# Patient Record
Sex: Female | Born: 1950
Health system: Southern US, Community
[De-identification: ages and names within clinical notes are randomized; demographics above are authoritative.]

## PROBLEM LIST (undated history)

## (undated) DIAGNOSIS — I10 Essential (primary) hypertension: Secondary | ICD-10-CM

## (undated) DIAGNOSIS — E039 Hypothyroidism, unspecified: Secondary | ICD-10-CM

## (undated) DIAGNOSIS — M069 Rheumatoid arthritis, unspecified: Secondary | ICD-10-CM

## (undated) DIAGNOSIS — C439 Malignant melanoma of skin, unspecified: Secondary | ICD-10-CM

## (undated) DIAGNOSIS — R87619 Unspecified abnormal cytological findings in specimens from cervix uteri: Secondary | ICD-10-CM

## (undated) DIAGNOSIS — R011 Cardiac murmur, unspecified: Secondary | ICD-10-CM

## (undated) DIAGNOSIS — T148XXA Other injury of unspecified body region, initial encounter: Secondary | ICD-10-CM

## (undated) DIAGNOSIS — N200 Calculus of kidney: Secondary | ICD-10-CM

## (undated) DIAGNOSIS — M810 Age-related osteoporosis without current pathological fracture: Secondary | ICD-10-CM

## (undated) HISTORY — DX: Cardiac murmur, unspecified: R01.1

## (undated) HISTORY — PX: MELANOMA EXCISION: SHX5266

## (undated) HISTORY — PX: OTHER SURGICAL HISTORY: SHX169

## (undated) HISTORY — PX: REDUCTION MAMMAPLASTY: SUR839

## (undated) HISTORY — DX: Other injury of unspecified body region, initial encounter: T14.8XXA

## (undated) HISTORY — PX: BREAST BIOPSY: SHX20

## (undated) HISTORY — PX: APPENDECTOMY: SHX54

## (undated) HISTORY — DX: Rheumatoid arthritis, unspecified: M06.9

## (undated) HISTORY — DX: Calculus of kidney: N20.0

## (undated) HISTORY — DX: Unspecified abnormal cytological findings in specimens from cervix uteri: R87.619

## (undated) HISTORY — PX: BREAST REDUCTION SURGERY: SHX8

## (undated) HISTORY — DX: Malignant melanoma of skin, unspecified: C43.9

## (undated) HISTORY — DX: Age-related osteoporosis without current pathological fracture: M81.0

---

## 1968-02-24 HISTORY — PX: CERVIX LESION DESTRUCTION: SHX591

## 2005-05-21 ENCOUNTER — Other Ambulatory Visit: Admission: RE | Admit: 2005-05-21 | Discharge: 2005-05-21 | Payer: Self-pay | Admitting: Obstetrics and Gynecology

## 2005-09-10 ENCOUNTER — Encounter (INDEPENDENT_AMBULATORY_CARE_PROVIDER_SITE_OTHER): Payer: Self-pay | Admitting: *Deleted

## 2005-09-10 ENCOUNTER — Inpatient Hospital Stay (HOSPITAL_COMMUNITY): Admission: EM | Admit: 2005-09-10 | Discharge: 2005-09-16 | Payer: Self-pay | Admitting: Emergency Medicine

## 2005-09-18 ENCOUNTER — Inpatient Hospital Stay (HOSPITAL_COMMUNITY): Admission: EM | Admit: 2005-09-18 | Discharge: 2005-10-09 | Payer: Self-pay | Admitting: Emergency Medicine

## 2005-10-16 ENCOUNTER — Ambulatory Visit (HOSPITAL_COMMUNITY): Admission: RE | Admit: 2005-10-16 | Discharge: 2005-10-16 | Payer: Self-pay | Admitting: Surgery

## 2005-11-04 ENCOUNTER — Ambulatory Visit (HOSPITAL_COMMUNITY): Admission: RE | Admit: 2005-11-04 | Discharge: 2005-11-04 | Payer: Self-pay | Admitting: Surgery

## 2005-11-09 ENCOUNTER — Inpatient Hospital Stay (HOSPITAL_COMMUNITY): Admission: RE | Admit: 2005-11-09 | Discharge: 2005-11-10 | Payer: Self-pay | Admitting: Surgery

## 2005-11-09 ENCOUNTER — Encounter (INDEPENDENT_AMBULATORY_CARE_PROVIDER_SITE_OTHER): Payer: Self-pay | Admitting: *Deleted

## 2005-12-01 ENCOUNTER — Encounter: Admission: RE | Admit: 2005-12-01 | Discharge: 2005-12-01 | Payer: Self-pay | Admitting: Surgery

## 2006-06-11 ENCOUNTER — Other Ambulatory Visit: Admission: RE | Admit: 2006-06-11 | Discharge: 2006-06-11 | Payer: Self-pay | Admitting: Internal Medicine

## 2006-06-15 ENCOUNTER — Ambulatory Visit (HOSPITAL_COMMUNITY): Admission: RE | Admit: 2006-06-15 | Discharge: 2006-06-15 | Payer: Self-pay | Admitting: Obstetrics & Gynecology

## 2007-06-16 ENCOUNTER — Ambulatory Visit (HOSPITAL_COMMUNITY): Admission: RE | Admit: 2007-06-16 | Discharge: 2007-06-16 | Payer: Self-pay | Admitting: Family Medicine

## 2007-09-13 ENCOUNTER — Other Ambulatory Visit: Admission: RE | Admit: 2007-09-13 | Discharge: 2007-09-13 | Payer: Self-pay | Admitting: Obstetrics and Gynecology

## 2008-07-02 ENCOUNTER — Ambulatory Visit: Payer: Self-pay | Admitting: Sports Medicine

## 2008-07-02 DIAGNOSIS — M6740 Ganglion, unspecified site: Secondary | ICD-10-CM | POA: Insufficient documentation

## 2008-07-19 ENCOUNTER — Encounter: Admission: RE | Admit: 2008-07-19 | Discharge: 2008-07-19 | Payer: Self-pay | Admitting: Endocrinology

## 2008-09-17 ENCOUNTER — Ambulatory Visit (HOSPITAL_COMMUNITY): Admission: RE | Admit: 2008-09-17 | Discharge: 2008-09-17 | Payer: Self-pay | Admitting: Obstetrics & Gynecology

## 2008-11-12 ENCOUNTER — Ambulatory Visit: Payer: Self-pay | Admitting: Sports Medicine

## 2009-01-03 ENCOUNTER — Encounter: Payer: Self-pay | Admitting: Sports Medicine

## 2009-01-03 ENCOUNTER — Ambulatory Visit: Payer: Self-pay | Admitting: Sports Medicine

## 2009-03-07 ENCOUNTER — Encounter: Payer: Self-pay | Admitting: Sports Medicine

## 2009-05-07 ENCOUNTER — Emergency Department (HOSPITAL_COMMUNITY): Admission: EM | Admit: 2009-05-07 | Discharge: 2009-05-08 | Payer: Self-pay | Admitting: Emergency Medicine

## 2009-09-30 ENCOUNTER — Ambulatory Visit (HOSPITAL_COMMUNITY): Admission: RE | Admit: 2009-09-30 | Discharge: 2009-09-30 | Payer: Self-pay | Admitting: Obstetrics & Gynecology

## 2010-03-25 NOTE — Consult Note (Signed)
Summary: [facility]  [facility]   Imported By: Marily Memos 03/07/2009 08:54:52  _____________________________________________________________________  External Attachment:    Type:   Image     Comment:   External Document

## 2010-04-17 ENCOUNTER — Other Ambulatory Visit: Payer: Self-pay | Admitting: Dermatology

## 2010-05-19 ENCOUNTER — Emergency Department (HOSPITAL_COMMUNITY): Payer: Federal, State, Local not specified - PPO

## 2010-05-19 ENCOUNTER — Emergency Department (HOSPITAL_COMMUNITY)
Admission: EM | Admit: 2010-05-19 | Discharge: 2010-05-19 | Disposition: A | Discharge status: Other Acute Inpatient Hospital | Payer: Federal, State, Local not specified - PPO | Source: Home / Self Care

## 2010-05-19 ENCOUNTER — Observation Stay (HOSPITAL_COMMUNITY)
Admission: EM | Admit: 2010-05-19 | Discharge: 2010-05-20 | Disposition: A | Discharge status: Home / Self Care | Payer: Federal, State, Local not specified - PPO | Source: Other Acute Inpatient Hospital | Attending: Cardiology | Admitting: Cardiology

## 2010-05-19 DIAGNOSIS — I10 Essential (primary) hypertension: Secondary | ICD-10-CM | POA: Insufficient documentation

## 2010-05-19 DIAGNOSIS — R079 Chest pain, unspecified: Secondary | ICD-10-CM | POA: Insufficient documentation

## 2010-05-19 DIAGNOSIS — I498 Other specified cardiac arrhythmias: Secondary | ICD-10-CM | POA: Insufficient documentation

## 2010-05-19 DIAGNOSIS — E039 Hypothyroidism, unspecified: Secondary | ICD-10-CM | POA: Insufficient documentation

## 2010-05-19 DIAGNOSIS — I200 Unstable angina: Principal | ICD-10-CM | POA: Insufficient documentation

## 2010-05-19 LAB — DIFFERENTIAL
Basophils Absolute: 0 10*3/uL (ref 0.0–0.1)
Lymphocytes Relative: 29 % (ref 12–46)
Lymphs Abs: 2 10*3/uL (ref 0.7–4.0)
Monocytes Relative: 6 % (ref 3–12)

## 2010-05-19 LAB — APTT: aPTT: 31 seconds (ref 24–37)

## 2010-05-19 LAB — CBC
MCH: 27.7 pg (ref 26.0–34.0)
Platelets: ADEQUATE 10*3/uL (ref 150–400)
RBC: 4.91 MIL/uL (ref 3.87–5.11)

## 2010-05-19 LAB — COMPREHENSIVE METABOLIC PANEL
AST: 24 U/L (ref 0–37)
Albumin: 4 g/dL (ref 3.5–5.2)
Calcium: 10.1 mg/dL (ref 8.4–10.5)
Chloride: 105 mEq/L (ref 96–112)
Creatinine, Ser: 0.85 mg/dL (ref 0.4–1.2)
GFR calc Af Amer: >60 mL/min (ref >60)
Total Bilirubin: 0.7 mg/dL (ref 0.3–1.2)
Total Protein: 7.1 g/dL (ref 6.0–8.3)

## 2010-05-19 LAB — POCT CARDIAC MARKERS
CKMB, poc: <1 ng/mL — ABNORMAL LOW (ref 1.0–8.0)
Troponin i, poc: <0.05 ng/mL (ref 0.00–0.09)

## 2010-05-19 LAB — PROTIME-INR
INR: 1.04 (ref 0.00–1.49)
Prothrombin Time: 13.8 seconds (ref 11.6–15.2)

## 2010-05-20 LAB — LIPID PANEL
HDL: 43 mg/dL (ref >39)
LDL Cholesterol: 98 mg/dL (ref 0–99)
Triglycerides: 95 mg/dL (ref <150)
VLDL: 19 mg/dL (ref 0–40)

## 2010-05-20 LAB — MAGNESIUM: Magnesium: 2.1 mg/dL (ref 1.5–2.5)

## 2010-05-20 LAB — CARDIAC PANEL(CRET KIN+CKTOT+MB+TROPI): Total CK: 47 U/L (ref 7–177)

## 2010-05-20 LAB — BASIC METABOLIC PANEL
CO2: 25 mEq/L (ref 19–32)
GFR calc Af Amer: >60 mL/min (ref >60)
Glucose, Bld: 105 mg/dL — ABNORMAL HIGH (ref 70–99)
Potassium: 3.8 mEq/L (ref 3.5–5.1)
Sodium: 142 mEq/L (ref 135–145)

## 2010-05-20 LAB — CBC
HCT: 37.2 % (ref 36.0–46.0)
Hemoglobin: 12.6 g/dL (ref 12.0–15.0)
RBC: 4.6 MIL/uL (ref 3.87–5.11)
WBC: 6.1 10*3/uL (ref 4.0–10.5)

## 2010-05-20 LAB — POCT CARDIAC MARKERS
CKMB, poc: <1 ng/mL — ABNORMAL LOW (ref 1.0–8.0)
Troponin i, poc: <0.05 ng/mL (ref 0.00–0.09)

## 2010-05-20 LAB — C-REACTIVE PROTEIN: CRP: 0.3 mg/dL — ABNORMAL LOW (ref <0.6)

## 2010-05-20 NOTE — H&P (Signed)
[name], [name] NO.:  [id number]  MEDICAL RECORD NO.:  [id number]           PATIENT TYPE:  E  LOCATION:  WLED                         FACILITY:  [facility]  PHYSICIAN:  Vania Rea, M.D. DATE OF BIRTH:  08-18-1950  DATE OF ADMISSION:  05/19/2010 DATE OF DISCHARGE:                             HISTORY & PHYSICAL   PRIMARY CARE PHYSICIAN:  Gretta Arab. Valentina Lucks, M.D.  CHIEF COMPLAINT:  Chest pain.  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  She exercises by walking about one to two miles once per week.  She is somewhat overweight.  The patient reports that typically when she is going uphill on her walk she tends to get chest discomfort radiating to her throat, which is relieved by rest,  but other than that has no history of chest discomfort.  No leg swelling or orthopnea.  The patient has been in good baseline state of health until about 10:30 this morning.  While resting at work, she had sudden onset of a sharp, right-sided chest pain in her breast, which radiated to her central chest, became a tightness and radiated up into her neck and her jaw.  She says she had a squeezing tightness but had no difficulty breathing.  No diaphoresis.  No dizziness, but she was slightly nauseous.  He felt it might be gastrointestinal and took some Tums, but it gave no relief.  Eventually, the patient presented to the emergency room some 40 minutes later, and by that time, the pain had decreased from an 8/10 to about a 5/10.  While waiting in the emergency room to be seen, the chest pain subsided, and she only had a mild discomfort in her neck area.  Eventually, she received sublingual nitroglycerin and aspirin, and the pain resolved completely.  She has no past history of a cardiac workup.  She is hypertensive and checks her own blood pressure about once per week, and it is typically 130/80.  She is always  had a low pulse in the 50s even before her diagnosis with hypothyroidism and continues to be bradycardic after treatment with Synthroid.  She has had no fever, cough or cold.  No black or bloody stool.  PAST MEDICAL HISTORY: 1. Hypertension. 2. Hypothyroidism.  PAST SURGICAL HISTORY:  Acute appendicitis treated with laparoscopic appendectomy complicated by postoperative abscess with fistula formation, now essentially healed.  That was some 5 years ago.  MEDICATIONS:  Synthroid 75 mcg daily, aspirin 81 mg daily, HCTZ 25 mg daily, Ativan 0.5 mg at bedtime as needed, vitamin D 1000 units daily, Citracal with D 1 tablet daily, multivitamins daily.  ALLERGIES:  No known drug allergies.  SOCIAL HISTORY:  Denies tobacco or illicit drug use.  Drinks one drink per week socially.  She is married and lives with her husband.  She has one grown daughter who lives away.  She works as a Engineer, civil (consulting) at a [employer] as noted up above.  CODE STATUS:  She is a full code.  FAMILY HISTORY:  Significant for father who died  of acute myocardial infarction at age 19.  Her mother currently has breast cancer.  She has a half brother who died of an acute MI at age 51.  She had a half brother who died of leukemia in infancy.  Other than this, she has no significant family medical history.  REVIEW OF SYSTEMS:  Significant only for insomnia, which she feels is related to her anxiety.  Other than this, her review of systems is unremarkable.  PHYSICAL EXAM:  A very pleasant, somewhat obese, Caucasian lady reclining in the stretcher.  She gives her height is 5 feet exactly and her weight as 158 pounds. VITAL SIGNS:  Her temperature is 97.8, her pulse is 50, respirations 16, blood pressure 168/62.  She is saturating 100% on 2 liters. Her pupils are round and equal, and mucous membranes pink.  Anicteric. No cervical lymphadenopathy or thyromegaly.  No carotid bruit.  No jugular venous  distention. CHEST:  Clear to auscultation bilaterally. CARDIOVASCULAR SYSTEM:  Regular rhythm.  No murmur. ABDOMEN:  Obese, soft, nontender.  No masses.  She has no reproducible chest wall tenderness. EXTREMITIES:  Muscle tone and bulk are good.  She has no arthritic deformities. Dorsalis pedis are 1+ bilaterally.  Her toes are warm.  Her skin is warm and dry.  No ulcerations. CENTRAL NERVOUS SYSTEM:  Cranial nerves II-XII are grossly intact.  She has no focal lateralizing signs.  LABORATORY DATA:  White count is 6.9, hemoglobin 13.6, platelets appear adequate on smear.  Her differential is normal.  Her sodium is 140, potassium 3.4, chloride 105, CO2 27, glucose 92, BUN 17, creatinine 0.85.  Her calcium is 10.1.  Her liver functions are completely normal. Her albumin is 4.0.  Portable chest x-ray shows no active disease in one view.  She has prominent osteophytes from the lower end of the thoracic spine.  EKG shows sinus bradycardia with a rate of 51.  She has Q-waves in leads III and aVF, and she has poor R-wave progression in the anterior chest leads.  ASSESSMENT: 1. Chest pain, Unstable angina 2. Hypertension, uncontrolled. 3. Anxiety. 4. Hypokalemia 5. Hypothyroidism. 6. Morbid obesity.  PLAN:  We will bring this lady on observation to rule out an acute coronary syndrome.  We will get serial cardiac enzymes, a fasting lipid panel, and a CRP, and we will give [facility] a call to discuss her case and will consider an inpatient versus an outpatient stress test.  Other plans as per orders.     Vania Rea, M.D.    LC/MEDQ  D:  05/19/2010  T:  05/19/2010  Job:  [id number]  Electronically Signed by Vania Rea M.D. on 05/20/2010 02:01:38 AM

## 2010-05-20 NOTE — Consult Note (Signed)
Bridget [name], MASTROPIETRO NO.:  [id number]  MEDICAL RECORD NO.:  [id number]           PATIENT TYPE:  E  LOCATION:  WLED                         FACILITY:  [facility]  PHYSICIAN:  Jake Bathe, MD      DATE OF BIRTH:  1950-05-27  DATE OF CONSULTATION:  05/19/2010 DATE OF DISCHARGE:                                CONSULTATION   REQUESTING PHYSICIAN:  Dr. Orvan Falconer, [facility].  REASON FOR CONSULTATION:  Evaluation of chest pain.  HISTORY OF PRESENT ILLNESS:  This is a 60 year old female with hypertension and family history of CAD with a brother dying suddenly of myocardial infarction at age 23 while working on his car and her father dying at age 15 from myocardial infarction suddenly (both of them were tobacco users), who presented to the [name] Emergency Department today with increasing chest discomfort, which now presented at rest. Originally, it started on the right side of her chest, but she also points to central chest pressure with radiation to her jaw that was eventually relieved after nitroglycerin was administered.  When walking up hills, for instance she has noted some of the central chest pressure with jaw/throat tightness, which is relieved with rest.  She denies any significant GERD-like symptoms, although after her appendectomy in 2007, she did have a brief period of GERD.  She currently is being treated for hypertension with hydrochlorothiazide, but this is quite borderline in the 140 systolic.  She is also being treated for hypothyroidism with Synthroid.  Her heart rate was noted to be slightly bradycardic.  EKG personally viewed shows sinus bradycardia rate 51 with inferior Q waves in III and aVF.  Otherwise, nonspecific ST-T wave changes are noted.  Poor R-wave progression is also noted.  QT interval was normal.  Currently, she is chest pain-free, but as I said above, she was quite concerned earlier today, which brought her into the ER.   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  PAST MEDICAL HISTORY: 1. Hypertension. 2. Hypothyroidism. 3. Appendectomy. 4. Overweight.  ALLERGIES:  AMLODIPINE does cause leg swelling.  FAMILY HISTORY:  As noted above, significant MI history.  SOCIAL HISTORY:  She is not a smoker.  No significant alcohol use. Works as a Engineer, civil (consulting) at [employer].  REVIEW OF SYSTEMS:  Denies any bleeding, melena, syncope, orthopnea, PND.  She has noted no significant shortness of breath, however, describes chest pressure, also bilateral shoulder pressure like someone is pushing down on her shoulders during heavy exertion.  Unless specified above, all other 12 review of systems negative.  PHYSICAL EXAMINATION:  VITAL SIGNS:  Heart rate 51, blood pressure 140/90, afebrile, respirations 16, saturating 98% on room air. GENERAL:  Alert, oriented x3, in no acute distress, mildly anxious, pleasant. HEENT:  Eyes, well-perfused conjunctivae, EOMI, no scleral icterus. Normocephalic, atraumatic.  Moist mucous membranes. NECK:  Supple.  No lymphadenopathy.  No thyromegaly.  No carotid bruits. No JVD. CARDIOVASCULAR:  Bradycardic, regular rhythm without any appreciable murmurs, rubs, or gallops. LUNGS:  Clear to auscultation bilaterally.  Normal respiratory effort. ABDOMEN:  Soft, nontender, normoactive bowel sounds, mildly  overweight. EXTREMITIES:  No clubbing, cyanosis, or edema.  Normal distal pulses. GU:  Deferred. RECTAL:  Deferred. NEURO:  Nonfocal.  No tremors. PSYCH:  Normal affect.  LABORATORY DATA:  Point-of-care cardiac markers are negative.  Sodium 140, potassium 3.4 (repletion has been ordered), BUN 17, creatinine 0.5. Liver functions are normal.  Albumin is 4.0.  White count 6.9, hemoglobin 13.6, hematocrit 40.0.  EKG as above, personally reviewed. Chest x-ray from today shows no active disease on one view of spine. Prominent osteophytes emanate off right and lower thoracic  spine.  ASSESSMENT/PLAN:  This is a 60 year old female with worsening chest discomfort central midline with radiation to her throat and jaw, with bilateral shoulder pressure, concerning for possible unstable angina with hypertension, strong family history of coronary artery disease and hypothyroidism.  Possible unstable angina - we will go ahead and transfer her to [facility] and place her on IV heparin.  No beta-blocker secondary to bradycardia.  She has already been given aspirin.  We will continue with this.  She states that her recent lipid panel was normal, but we will check a fasting lipid profile and administer statin if needed.  Given her constellation of symptoms worsening with exertion and somewhat typical in presentation for angina, we have discussed cardiac catheterization.  Risks and benefits of procedure including stroke, heart attack, death, renal impairment had been explained to the patient at length.  Given her risk factors, especially family history and her symptoms of angina, I do feel as though this is warranted.  Her EKG showing inferior Q waves may be false positive.  Left ventriculogram will help with this.  I have spoken with the catheterization lab.  We will be able to perform procedure tomorrow morning at 7:30 in the morning with Dr. Anne Fu.  We will check a TSH and a free T4 and Dr. Orvan Falconer repleted her potassium due to hypokalemia and placed her on captopril 2.5 mg t.i.d.     Jake Bathe, MD     MCS/MEDQ  D:  05/19/2010  T:  05/20/2010  Job:  [id number]  cc:   Dr. Orvan Falconer  Electronically Signed by Donato Schultz MD on 05/20/2010 01:55:23 PM

## 2010-05-21 LAB — POCT ACTIVATED CLOTTING TIME: Activated Clotting Time: 128 seconds

## 2010-05-25 HISTORY — PX: GANGLION CYST EXCISION: SHX1691

## 2010-05-25 NOTE — Procedures (Signed)
NAMELAYLAH, RIGA               ACCOUNT NO.:  [id number]  MEDICAL RECORD NO.:  [id number]           PATIENT TYPE:  LOCATION:                                 FACILITY:  PHYSICIAN:  Jake Bathe, MD      DATE OF BIRTH:  Jul 09, 1950  DATE OF PROCEDURE:  05/20/2010 DATE OF DISCHARGE:                           CARDIAC CATHETERIZATION   PROCEDURE:  Cardiac catheterization via groin approach.  PROCEDURE DETAILS:  Informed consent was obtained.  Risk of stroke, heart attack, death, renal impairment, arterial damage, bleeding were explained with the patient at length.  INDICATIONS:  Unstable angina, increasing chest discomfort when walking up hill, relieved with rest with pressure on bilateral shoulders.  For full details, please see consult note and HPI.  PROCEDURE DETAILS:  Lidocaine 1% was used for local anesthesia over the right radial artery site, and then after unsuccessful attempts at cannulating the right radial artery, this was transitioned over to the right groin access.  This was successful without difficulty and a 5- [place] sheath was inserted in the right femoral artery without difficulty.  A Judkins left #4 catheter and a Judkins right #4 catheter were used to selectively cannulate the coronary arteries.  Multiple views with hand injection of Omnipaque were obtained.  An angled pigtail was then used to cross the left ventricle and the left ventriculogram was performed in the RAO position utilizing 15 mL of contrast.  The catheter was then pulled back to the aortic root, and aortic root shot was then performed to search for any possibility of an anomalous circumflex.  This was normal negative.  Following procedure, femoral artery catheter was removed without difficulty.  She was hemodynamically stable.  Right radial artery site was also stable without any evidence of hematoma.  FINDINGS: 1. Left ventriculogram - normal left ventricular ejection fraction of     65%  with no wall motion abnormality.  No significant mitral     regurgitation or aortic stenosis.  Left ventricular systolic     pressure is 110 over end-diastolic pressure of 11.  Aortic pressure     is 110/66. 2. Right coronary artery - large vessel dominant with two significant     posterolateral branches comprising much of the lateral wall     distribution.  There was no angiographically significant coronary     artery disease present. 3. Left main artery - widely patent.  This gives rise to LAD as well     as a small circumflex artery.  The LAD comprises two diagonal     branches, both of which are widely patent.  Circumflex artery is     small but widely patent.  Once again, the right coronary artery     provides the majority of the lateral wall distribution.  IMPRESSION: 1. No angiographically significant coronary artery disease. 2. Large right dominant coronary artery supplying most of the lateral     wall distribution with small circumflex distribution. 3. Normal left ventricular ejection fraction of 65% with no wall     motion abnormality and no mitral regurgitation and no aortic  stenosis identified.  PLAN:  Findings have been discussed with the patient reassuring cardiac catheterization.  Continue with medical management and risk factor modification.  We will see her back in clinic in approximately 1 week for postcatheterization followup via right groin access.     Jake Bathe, MD     MCS/MEDQ  D:  05/20/2010  T:  05/20/2010  Job:  [id number]  Electronically Signed by Donato Schultz MD on 05/25/2010 07:49:53 AM

## 2010-07-06 NOTE — Discharge Summary (Signed)
  Bridget [name], Bridget [name]               ACCOUNT NO.:  [id number]  MEDICAL RECORD NO.:  [id number]           PATIENT TYPE:  O  LOCATION:  2020                         FACILITY:  [facility]  PHYSICIAN:  Jake Bathe, MD      DATE OF BIRTH:  [birth year]  DATE OF ADMISSION:  05/19/2010 DATE OF DISCHARGE:  05/20/2010                              DISCHARGE SUMMARY   FINAL DIAGNOSES: 1. Chest pain/concerning for unstable angina - increasing chest     discomfort while walking up hills, relieved with rest, pressure in     bilateral shoulders.  For full details, please see consult note and     HPI. 2. Hypertension. 3. Hypothyroidism. 4. Strong family history of coronary artery disease.  PROCEDURES:  Cardiac catheterization on May 20, 2010, via the groin approach, normal LV function, no angiographically significant coronary artery disease, large dominant right coronary artery.  Chest x-ray, no active disease.  Cardiac markers all negative.  Total cholesterol 160, triglycerides 95, HDL 43, LDL 98.  Sodium 142, potassium 3.8, BUN 12, creatinine 0.7, hemoglobin 12.6.  TSH normal.  BRIEF HOSPITAL COURSE:  A 60 year old female with hypertension, family history of CAD with her brother died suddenly of MI at age 44 while working on his car, father dying at age 24 from MI suddenly, who presented to [facility] Emergency Department with increasing chest discomfort while walking up hill, which then presented at rest.  She had jaw and throat tightness radiation.  EKG showed sinus bradycardia with inferior Q-waves III and aVF.  Nonspecific changes.  Poor R-wave progression.  She was painfree.  Works as a Tax adviser at [employer].  Given the possibility of unstable angina, she was transferred to [facility], placed on IV heparin.  We discussed cardiac catheterization given her strong risk factors and proceeded with this the next day.  Cardiac catheterization was reassuring as above.  DISCHARGE  MEDICATIONS: 1. Synthroid 75 mcg once a day. 2. Aspirin 81 mg once a day. 3. Hydrochlorothiazide 25 mg once a day. 4. Ativan 1 mg 1/2 tablet once a day p.r.n. insomnia. 5. Vitamin D3 1000 units once a day. 6. Citracal and vitamin D 1 tablet once a day. 7. Multivitamins once a day. 8. Lisinopril 10 mg once a day.  Please see med reconciliations.  FOLLOWUP:  She has followup scheduled with me in 1 week to assess groin site postcatheterization.     Jake Bathe, MD     MCS/MEDQ  D:  07/04/2010  T:  07/04/2010  Job:  [id number]  Electronically Signed by Donato Schultz MD on 07/06/2010 09:37:45 PM

## 2010-07-11 NOTE — Consult Note (Signed)
NAMEHARTLYN, REIGEL               ACCOUNT NO.:  [id number]   MEDICAL RECORD NO.:  [id number]          PATIENT TYPE:  INP   LOCATION:  5007                         FACILITY:  [facility]   PHYSICIAN:  Shirley Friar, MDDATE OF BIRTH:  [birth year]   DATE OF CONSULTATION:  DATE OF DISCHARGE:                                   CONSULTATION   REASON FOR CONSULTATION:  Abdominal pain, diarrhea.   HISTORY OF PRESENT ILLNESS:  This is a 60 year old white female status post  appendectomy on June19,2007 who developed diarrhea post procedure as well as  waxing and waning right lower quadrant abdominal pain.  She reports moving  her bowels 2-3 three times an hour starting two days postoperatively and  continuing up until yesterday.  She had Clostridium difficile toxin negative  x2 and was started empirically on Flagyl.  She continued to have diarrhea  20+ watery stools a day after discharge on June25,2007 and due to  persistence of his diarrhea, nausea and abdominal pain.  She was admitted  for further evaluation.  She had a CT scan done last night which showed the  5 x 3.5 cm enhancing fluid collection consistent with an abscess in the  right lower quadrant area.  It also showed dilated small bowel loops with  air-fluid levels consistent with partial small bowel obstruction versus  ileus.  She has been placed on broad-spectrum antibiotics with IV Zosyn.  This morning she reports she has not had any diarrhea since yesterday but  continues to have abdominal pain that is controlled with pain medicines.   She denies any diarrhea prior to her appendectomy and reports having normal  virtual colonoscopy in 2003, which was done for screening purposes.  She  denies any family history of colon cancer or inflammatory bowel disease.   PAST MEDICAL HISTORY:  1.  Hypothyroidism.  2.  Osteoporosis.  3.  Status post laparoscopic appendectomy as stated above on September 10, 2005.   ADMISSION MEDICATIONS:  1.   Synthroid 37.5 mcg every day.  2.  Fosamax.  3.  Flagyl.  4.  Zofran.  5.  Tylenol No 3.   PHYSICAL EXAMINATION:  VITAL SIGNS:  Temperature is 97.4, pulse 52, blood  pressure 125/71.  GENERAL:  Alert, mild acute distress.  HEENT:  Nonicteric sclerae.  CHEST:  Clear to auscultation anteriorly.  CARDIOVASCULAR:  Regular rhythm.  ABDOMEN:  Diffuse tenderness with guarding, positive bowel sounds.   LABORATORY DATA:  White blood count 13.1, hemoglobin 12.5, platelet count  462, 000.  Potassium 2.8, BUN 6, creatinine 0.9, total bilirubin 1.3, ALP  96, AST 88, ALT 92, amylase 48, lipase 20.  TSH 2.37.   IMPRESSION:  A 54-year white female with profuse watery diarrhea and  abdominal pain with an abscess noted on CAT scan in the location of her  previous appendicitis. Broad-spectrum antibiotics and supportive care for  her abscess.  Suspected diarrhea is secondary to her abscess in combination  with this ileus versus partial small bowel obstruction.  No indication of  any infectious colitis as the source of her symptoms.  If  the diarrhea  persists after resolution of abscess and Clostridium difficile negative, may  need to do a sigmoidoscopy with biopsy.  At this time would continue IV  antibiotics and supportive care.  If further questions occur, please do not  hesitate to call us.   Thank you again for this consult.      Shirley Friar, MD  Electronically Signed     VCS/MEDQ  D:  09/19/2005  T:  09/19/2005  Job:  [id number]

## 2010-07-11 NOTE — Discharge Summary (Signed)
Bridget [name], Bridget [name] NO.:  [id number]   MEDICAL RECORD NO.:  [id number]          PATIENT TYPE:  INP   LOCATION:  5007                         FACILITY:  [facility]   PHYSICIAN:  Leonie Man, M.D.   DATE OF BIRTH:  [birth year]   DATE OF ADMISSION:  09/18/2005  DATE OF DISCHARGE:  10/09/2005                                 DISCHARGE SUMMARY   ADMITTING PHYSICIAN:  Ovidio Kin, MD   OPERATIVE PHYSICIAN:  Dr. Luretha Murphy   CONSULTANTS:  1. Dr. Bosie Clos and Dr. Randa Evens for GI.  2. Primary care physician, Gretta Arab. Valentina Lucks, M.D.  3. Psychiatry, Dr. Antonietta Breach.   CHIEF COMPLAINT AND REASON FOR ADMISSION:  [name] is a 61 year old female  patient who underwent laparoscopic appendectomy by Dr. Daphine Deutscher on July 9th.  Post procedure, patient developed diarrhea.  C. difficile stools were  negative prior to discharge but patient was started empirically on Flagyl  before discharge.  Her white cell count was normal when she was discharged  on July 25th.  Since discharge, the patient has had continuous bilious  diarrhea, up to 20 stools per day.  She is nauseous and complain of severe  weakness.  No fevers.  She has been using Zofran.  Because of her continued  symptoms, she called our office and was sent to the ER for additional  evaluation.  She had been complaining of some pain in her right calf.  A DVT  was done in the ER and this shows no DVT.   On exam in the ER, patient was afebrile, hypotensive, pulse 59.  Abdomen was  soft and distended with diminished bowel sounds, tender in the right lower  quadrant, with minimal guarding.  She was also tender in the right upper  quadrant.  No rebounding.   Labs were pending at time of admission.   Patient was admitted with the following diagnoses.  1. Abdominal pain and diarrhea post appendectomy, rule out abscess.  2. Volume depletion.  3. Hemangioma versus lesion of the liver, with planned MRI outpatient as  recommended by radiologist, last admit.   HOSPITAL COURSE:  The patient was admitted to the general floor where a CBC,  C-Med, amylase and lipase were checked.  Patient's initial white count was  elevated at 13,100.  Her potassium was low at 2.8 and this was repleted IV.  She had mild transaminitis with a mild elevation of total bilirubin,  suspected due to volume depletion.  CT scan of the abdomen and pelvis on  admission did demonstrate a peripherally enhanced fluid collection with  loculation suspected to be an abscess.  This was in the central pelvis.  This measured 5 x 3.5 cm.  Because of these findings, interventional  radiology was consulted and patient underwent CT-guided percutaneous  drainage of same abscess.  For the next several days, patient was followed  closely.  Her white count peaked at 18,600 but began to trend down.  She  still had difficulty with hypokalemia and IV repletion was ordered.  Her  abdomen was benign.  She was having flatus, and  on September 21, 2005, she was  having no further diarrhea.  Interventional radiology also was following  along with [place].   Repeat CT on September 24, 2005, demonstrated much improvement in the previous  collection of abscess fluid, but there was an additional small collection  adjacent.  There was some concern about possible fistula formation.  Patient  did undergo fistulogram which showed communication with small bowel loop,  right lower quadrant.  Drain was left in place.   The fluid collected from the initial drain placement demonstrated E. coli  which was pansensitive and Klebsiella which was resistant to ampicillin.  Patient had been placed on Zosyn at admission, and this was continued.   About this time, patient began to develop further reflux, nausea, and  diarrhea symptoms.  C. diff were checked and these were negative.  Because  of these continued problems, GI consult was obtained and because patient had  a fistula, she was  initially on n.p.o., then later advanced to clear  liquids, although she was not tolerating these due to continued nausea and  abdominal pain.  Because of the fistula, a PICC line was placed and patient  was also placed on TNA.   Dr Bosie Clos was the GI doctor who initially saw the patient for complaints  of diarrhea.  She underwent a colonoscopy on September 28, 2005.  This  essentially was normal.  Patient, thus was continued on attempts at clear  liquid diet and IV nutrition.  About around this time, patient's white count  had also normalized.  Her hemoglobin was low at 10.8 and this was suspected  due to acute reaction to nutrition and surgical stress.  Patient had also  had repeat fluid cultures drawn out of the percutaneous drain because of the  recurrent diarrhea.  These cultures eventually grew out Enterobacter which  was resistant to Ancef and cefotetan but sensitive to ceftriaxone, noting  that patient had already been switched to ceftriaxone earlier in the  hospitalization, once the initial wound cultures came back for pansensitive  E. Coli.   Patient continued to have serial CTs to evaluate the abscess collection an  by October 06, 2005, patient's abscess was resolved but she still had a small  fistula, so drain was left in place.  She continued with problems related to  nausea and diarrhea.  She had been placed on Protonix empirically because of  reflux symptoms.  This was discontinued and patient was placed instead on a  non PPI agent; she was started on Pepcid.  She had also been started on some  Reglan but this was discontinued because it was felt this may be  contributing to the diarrhea.  Because of the continued reflux and other GI  symptoms, gastroenterology was re-consulted.  Dr. Randa Evens saw the patient at  this time.  An EGD was done and showed no acute process, and he recommended  placing the patient back on Reglan.  About this time, had a long discussion with patient,  husband, and other  family friends.  They were concerned about patient's total well being  including her affect and depression.  Since hospitalization, she had become  somewhat fatalistic and they felt that these symptoms were contributing to  some of her physiological changes.  Dr. Jeanie Sewer with psychiatry was  consulted.  Please refer to his note for additional details.  Patient was  started on Remeron. This did help her symptoms but, unfortunately, she  developed mild leukopenia, so this medication  was discontinued.  He also  recommended continued Ativan.  After the talk with Dr. Jeanie Sewer, patient's  affect improved greatly.  Reviewing his notes, patient has apparently had  two recent deaths in the family and this has contributed to her fatalistic  outlook with these postoperative complications.  By October 07, 2005, again  patient's affect was much improved.  She had mentioned that she was having a  bad taste in her mouth early in the morning and anorexia after receiving  TNA, so this was finally discontinued.  Antibiotics were discontinued and  plans were to discharge patient home on October 08, 2005, if she was able to  tolerate a diet.  With the discontinuation of the Remeron, patient seemed to  be doing well on the Ativan.  There was a note left by psychiatry that  stated if patient had additional symptoms, that we could try Lexapro 5 mg at  hour of sleep.  At the present time, patient feels that she is okay with the  current medication regimen.   By date of discharge, October 09, 2005, patient was afebrile.  Vital signs  were stable.  Abdomen was benign.  She was tolerating more frequent  feedings, again not up to what she was eating preoperatively but much  better.  Pre-albumin was 20.8, hemoglobin 10.9, white count 3,700, platelets  296,000; sodium 143, potassium 3.7, BUN 10, creatinine 0.9.  She still had  her percutaneous drain in place.  Interventional radiology saw the  patient  with recommendations to flush the drain with 5 mL every day, and patient has  a CT scheduled for next week to re-evaluate the fistula and possibility of  discontinuation of the same drain.  Patient was also still having a few  loose stools, but much better than even several days ago.  Patient's PICC  line site was unremarkable.  No redness or tenderness, and plans were to  discontinue the PICC line prior to discharge as well.   FINAL DISCHARGE DIAGNOSES:  1. Abdominal pain with intractable bilious diarrhea, resolving.  2. Pelvic abscess after laparoscopic appendectomy procedure on September 10, 2005.  3. Small enterocolonic fistula, resolving.  4. Anxiety and depression, improved.  5. Leukopenia secondary to Remeron, stable.  6. Anemia.  7. Hypothyroidism, TSH normal this admission.   DISCHARGE MEDICATIONS:  Patient will resume home medications.  1. Ativan 1 mg one-half to one tablet every 6 hours as needed.  Patient     has been instructed to use this sparingly.  2. Dilaudid 2 mg every 6 hours as needed for pain.  Patient does not      tolerate Ultram, Percocet, or Vicodin, and has also been instructed to      use these sparingly.  3. Pepcid AC b.i.d.  4. Mylanta Gas over-the-counter, as needed.   RETURN TO WORK:  After you follow up with Dr. Daphine Deutscher and discuss this with  him.   DIET:  Essentially no restrictions, as tolerated.  Patient knows to avoid  heavy and spicy foods.   ACTIVITY:  Increase activity slowly.  May sponge bathe while drain is in  place.  No driving while taking Dilaudid.  No lifting more than 10 pounds  until drain has been discontinued.   FOLLOWUP:  1. You have a CT of the abdomen and pelvis scheduled with the radiology      department at [facility] to evaluate      the fistula on October 16, 2005, at 7:30 a.m.  2. You need to call Dr. Ermalene Searing office to be seen in 2 weeks, [phone].  3. Follow up with Dr. Valentina Lucks as needed especially if Ativan  is not      controlling your psychiatric symptoms.      Allison L. Rennis Harding, N.P.      Leonie Man, M.D.  Electronically Signed    ALE/MEDQ  D:  10/09/2005  T:  10/09/2005  Job:  [id number]   cc:   Thornton Park Daphine Deutscher, MD  Antonietta Breach, M.D.  Shirley Friar, MD  Gretta Arab Valentina Lucks, M.D.

## 2010-07-11 NOTE — Consult Note (Signed)
NAMEALYRA, Bridget NO.:  [id number]   MEDICAL RECORD NO.:  [id number]          PATIENT TYPE:  INP   LOCATION:  5007                         FACILITY:  [facility]   PHYSICIAN:  Antonietta Breach, M.D.  DATE OF BIRTH:  [birth year]   DATE OF CONSULTATION:  10/07/2005  DATE OF DISCHARGE:                                   CONSULTATION   [name] slept well last night.  Her nausea was decreased today, although  her appetite was not increased.  She had more motivation to eat, and did  increase her food and liquid intake.  She was able to walk in the hall some  today.  She has hope for the future, and is expressing some interest in  possibly going home this weekend, when her daughter will visit.  She is not  having any averse psychotropic effects.  The TSH was within normal limits.   The white blood cell count is decreased to 3.8, which is a significant  decrease from 4.6.   VITAL SIGNS:  Temperature is 98.1.  Pulse 54.  Respirations 16.  Blood  pressure of 113/57.   MENTAL STATUS EXAMINATION:  [name] is alert.  She is oriented to all  spheres.  Her speech is within normal limits. Thought process is logical,  coherent, and goal-directed.  No looseness of associations of thought  content.  No thoughts of harming herself.  No thoughts of harming others.  No delusions.  No hallucinations.  Memory is intact to immediate, recent,  remote, and affect is mildly constricted.  Insight is good.  Judgment is  intact.   ASSESSMENT:  1. 293.83 mood disorder, not otherwise specified, depressed (functional      and general medical factors).  2. Rule out major depressive disorder, single episode, severe.  3. 293.84 anxiety disorder, not otherwise specified.   RECOMMENDATION:  1. We will review her white blood cell count trend and likely discontinue      the Remeron if this represents a significant decrease  2. We will repeat a white blood cell count.  3. We will continue to  utilize Ativan 0.5 to 1 mg q. 6 hours p.r.n.      anxiety, and we will continue to reinforce the patient's cognitive and      behavioral therapy as well as need of supportive therapy.  4. We will consider using an SSRI such as Lexapro 5 mg q. a.m.      Antonietta Breach, M.D.  Electronically Signed     JW/MEDQ  D:  10/07/2005  T:  10/07/2005  Job:  [id number]

## 2010-07-11 NOTE — Op Note (Signed)
NAMESANDRALEE, [name]               ACCOUNT NO.:  [id number]   MEDICAL RECORD NO.:  [id number]          PATIENT TYPE:  INP   LOCATION:  2550                         FACILITY:  [facility]   PHYSICIAN:  Thornton Park. Daphine Deutscher, MD  DATE OF BIRTH:  Nov 13, 1950   DATE OF PROCEDURE:  09/10/2005  DATE OF DISCHARGE:                                 OPERATIVE REPORT   PREOPERATIVE DIAGNOSIS:  Acute appendicitis.   POSTOPERATIVE DIAGNOSIS:  Acute suppurative appendicitis.   PROCEDURE:  Laparoscopic appendectomy.   SURGEON:  Thornton Park. Daphine Deutscher, MD.   ANESTHESIA:  General endotracheal.   DRAINS:  None.   DESCRIPTION OF PROCEDURE:  Ms. Botsford was taken to OR 17 on the afternoon of  September 10, 2005.  The abdomen was prepped with chlorhexidine and draped  sterilely.  A Foley was inserted.  I went in through the umbilicus using  Hassan technique without difficulty.  A 5 mm was placed in the right upper  abdomen and a 11 in the left lower quadrant.  Eventually the angled scope,  30 degree, was used.  She had a fair degree of suppuration around the  appendix and I had to tease the appendix away from the surrounding bowel.  This being done, I then isolated the base with a right angled clamp and  stapled across with the Endo-GIA vascular.  I then went through the  mesentery of the appendix with a harmonic scalpel and put the appendix in a  bag and brought it out through the umbilicus.  In surveying the abdomen, I  did not see any obvious metastatic disease or tumors and the liver grossly  looked normal from our vantage point.  She had she had some areas in her  right lobe that I had seen but they appeared to be intraparenchymal and  these need to be worked because they were thought to possibly be  hemangiomas.  No other abnormalities were noted.  I looked into the pelvis  and sucked out some greenish looking purulent material.  This was not a  grossly perforated appendix although she had a fair amount of  suppuration  and the appendix itself was in the latter stages of infection but had not  perforated that I could tell.   I irrigated and no bleeding was noted.  I then repaired the umbilical defect  with two simple stitches of #0 Vicryl and then withdrew the scope and  deflated the abdomen.  The wounds were closed with 4-0 Vicryl subcuticularly  with Benzoin and Steri-Strips.  The patient seemed to tolerate the procedure  well and was taken to the recovery room in satisfactory condition.      Thornton Park Daphine Deutscher, MD  Electronically Signed     MBM/MEDQ  D:  09/10/2005  T:  09/11/2005  Job:  [id number]   cc:   L. Lupe Carney, M.D.  Fax: [phone]

## 2010-07-11 NOTE — Op Note (Signed)
Bridget [name], Bridget [name]               ACCOUNT NO.:  [id number]   MEDICAL RECORD NO.:  [id number]          PATIENT TYPE:  INP   LOCATION:  5007                         FACILITY:  [facility]   PHYSICIAN:  James L. Malon Kindle., M.D.DATE OF BIRTH:  [birth year]   DATE OF PROCEDURE:  10/07/2005  DATE OF DISCHARGE:                                 OPERATIVE REPORT   PROCEDURE:  Esophagogastroduodenoscopy.   MEDICATIONS:  Cetacaine spray, fentanyl 50 mcg, Versed 5 mg IV.   INDICATION:  Patient with admission for abdominal pain and diarrhea  following an appendectomy, June 19, who developed an abscess.  Had a  sigmoidoscopy that was okay.  She has got a drain in the abscess and is on  antibiotics.  Still having trouble eating, with some nausea and vomiting  despite the use of medications.  Still throwing up.  She was started on  Reglan.  Due to her nausea and vomiting, an endoscopy was done to rule out  ulcer.   DESCRIPTION OF PROCEDURE:  The procedure was explained to the patient.  Consent obtained.  The patient in the left lateral decubitus position, the  Olympus scope was inserted and advanced.  The stomach was entered.  The  pylorus was identified and passed.  The duodenum including the bulb and  second portion was unremarkable.  The scope was withdrawn back to the  stomach.  The pyloric channel was seen well.  It was normal.  There was no  gastric outlet obstruction.  The pyloric area and body of the stomach were  totally normal.  No gastritis or ulcerations.  The scope was withdrawn.  The  distal and proximal esophagus were seen well, were endoscopically normal.  The scope was withdrawn.  The patient tolerated the procedure well.   ASSESSMENT:  Nausea and vomiting with essentially normal endoscopy.   PLAN:  Would continue the patient on metoclopramide in low dose.  This has  apparently been ordered; she is not currently getting it.  Will check into  that.  Slowly advance her diet.  Hopefully,  she will continue to improve.           ______________________________  Llana Aliment. Malon Kindle., M.D.     Waldron Session  D:  10/07/2005  T:  10/07/2005  Job:  [id number]   cc:   Dr. Myrtie Hawk C. Valentina Lucks, M.D.

## 2010-07-11 NOTE — Discharge Summary (Signed)
Bridget [name], TAPSCOTT               ACCOUNT NO.:  [id number]   MEDICAL RECORD NO.:  [id number]          PATIENT TYPE:  INP   LOCATION:  3005                         FACILITY:  [facility]   PHYSICIAN:  Sandria Bales. Ezzard Standing, M.D.  DATE OF BIRTH:  November 01, 1950   DATE OF ADMISSION:  09/10/2005  DATE OF DISCHARGE:  09/16/2005                                 DISCHARGE SUMMARY   DATE OF BIRTH:  22-Feb-1951   DATE OF ADMISSION:  September 10, 2005   DATE OF DISCHARGE:  September 16, 2005   DISCHARGE DIAGNOSES:  1.  Appendicitis.  2.  Probable hemangioma of liver.  Plan followup MRI of liver.  3.  Postoperative diarrhea.  C. Diff negative X 2.   OPERATION PERFORMED:  The patient underwent a laparoscopic appendectomy by  Dr. Luretha Murphy on September 10, 2005.   [name] is a 60 year old white female who presented to [facility]  on September 20, 2005.  She is a patient of Dr. Maurice Small.  She had  appendicitis and underwent a laparoscopic appendectomy by Dr. Luretha Murphy  on September 10, 2005.  Her final pathology showed acute suppurative  appendicitis.  There is an ultrasound report which showed a negative  abdominal ultrasound (and there is no mention of liver lesions), but there  is no CT scan report in the chart at the time of this dictation.  But in the  computer, the CT scan sees multiple liver lesions that are probable  hemangiomas.  The question of metastatic cancer is raised, but the patient  has no know pirmary cancer.  A followup MRI is suggested.   Notes by Dr. Daphine Deutscher indicate the patient had a liver lesion, possible  hemangioma.   Postoperatively she had difficulties in that she had continued abdominal  pain with abdominal distention.  She developed diarrhea.Two separate stool  specimens were negative for C. diff.   She was started on Flagyl orally for antibiotic-induced diarrhea on September 14, 2005, so she has had 2 days of this.  She is now 5 days postop after 2 days  of  antibiotics.  Her abdominal pain is better, though she still has some  moderate distention.  Her last white blood count was 8300 on September 15, 2005,  with a hemoglobin of 11.9, and she is now ready for discharge.   The floor will get a copy of her abdominal CT scan report and give it to her  so she can discuss this with Dr. Daphine Deutscher when she has a followup in the  office.  I have talked to Germaine at our office who will go ahead and  obtain a MRI to evaluate the liver.   I am sending her home on Tylenol No. 3 for pain; Flagyl 500 mg 3 times a day  for 5 more days, for a total of 7 days of Flagyl; and I gave her Ativan 0.5  mg 10 tablets for helping her sleep at night.  Again, she knows to make an appointment for Dr. Daphine Deutscher in 10 days and to  call for  any other problems.      Sandria Bales. Ezzard Standing, M.D.  Electronically Signed     DHN/MEDQ  D:  09/16/2005  T:  09/16/2005  Job:  [id number]   cc:   Gretta Arab. Valentina Lucks, M.D.  Fax: [phone]

## 2010-07-11 NOTE — Op Note (Signed)
[name], [name]               ACCOUNT NO.:  [id number]   MEDICAL RECORD NO.:  [id number]          PATIENT TYPE:  INP   LOCATION:  1332                         FACILITY:  [facility]   PHYSICIAN:  Thornton Park. Daphine Deutscher, MD  DATE OF BIRTH:  1950-10-15   DATE OF PROCEDURE:  11/09/2005  DATE OF DISCHARGE:  11/04/2005                                 OPERATIVE REPORT   PREOPERATIVE DIAGNOSIS:  Small bowel fistula.   POSTOPERATIVE DIAGNOSIS:  Small bowel fistula from percutaneous drainage  catheter in the small intestine.   PROCEDURE:  Laparoscopic takedown of small bowel fistula.   DESCRIPTION OF PROCEDURE:  [name] is a 60 year old white female who  had suppurative appendicitis treated with laparoscopic appendectomy in July.  Postoperatively, she had an abscess which was drained percutaneously with  some concern that there were loops of bowel surrounding the abscess.  This  area was drained and then following pulling of the drain, an abscess  recurred and on injection there was found to be continuation or drain seemed  to communicate with a small bowel fistula.  The second drain was placed and  this was kept in place and despite very little drainage from the catheter,  injection revealed a persistent anterior fistula.   The patient was taken to room one and prepped with chlorhexidine, draped  sterilely.  The previous umbilical incision was reincised and the abdomen  was entered using Hasson technique without difficulty.  Two trocars were  placed one 5 through her previous incision in the right upper quadrant and  also through her left lower quadrant incision and these were all three  previous incisions were used.  I found the anterior lip of bowel stuck up to  the anterior abdominal wall through the percutaneous drainage site.  It  appeared that this was a fistula that came through the skin into this small  bowel through and through the small bowel where there was a second piece of  bowel stuck to it.  I divided both of these areas with the Endo-GIA.  I then  carefully dissected free through the mesentery.  I went to the appendiceal  stump which looked good.  I took the staples down where they were stuck up  onto the iliac vessels, pulled the tube down off this area.  She had a lot  of matted adhesions were she had this previous abscess.  I tried to free  most of these as best I could.  I irrigated.  I saw no evidence of succus  entericus or any evidence of any leaks or other undrained abscesses.  I felt  like things were resolved to my satisfaction.  Port sites were injected with  Marcaine.  The umbilical port was repaired with two sutures of 0 Vicryl  using the angled laparoscope from the left lower quadrant.  Abdomen was then  deflated and the other trocars withdrawn.  The skin was approximated with 4-  0 Vicryl with Steri-Strips.  The patient tolerated procedure well was taken  to recovery room in satisfactory condition.      Thornton Park  Daphine Deutscher, MD  Electronically Signed     MBM/MEDQ  D:  11/09/2005  T:  11/10/2005  Job:  [id number]   cc:   Gretta Arab. Valentina Lucks, M.D.  Fax: [phone]

## 2010-07-11 NOTE — H&P (Signed)
NAMEMARINE, Bridget [name] NO.:  [id number]   MEDICAL RECORD NO.:  [id number]          PATIENT TYPE:  EMS   LOCATION:  MAJO                         FACILITY:  [facility]   PHYSICIAN:  Sandria Bales. Ezzard Standing, M.D.  DATE OF BIRTH:  [birth year]   DATE OF ADMISSION:  09/18/2005  DATE OF DISCHARGE:                                HISTORY & PHYSICAL   CHIEF COMPLAINT:  Abdominal pain, nausea, and excessive diarrhea.   HISTORY OF PRESENT ILLNESS:  Bridget [name] is a 60 year old female patient  recently status post laparoscopic appendectomy by Dr. Sheron Nightingale on July 19.  Immediately post procedure, patient developed diarrhea.  C. difficile toxins  were checked and were negative x2 while hospitalized.  The patient was  started on empiric Flagyl.  Her white count was normal, and she was  discharged home on July 25.  Since discharge, the patient reports  intractable bilious diarrhea, up to 20+ stools per day.  She has nausea and  weakness.  No fever.  She has been using Zofran.  She has had no urinary  symptoms such as dysuria or frequency.  Because of the continued symptoms,  she called our office today and was sent to the ER for evaluation.  She  complains of pain in her right calf, and there was some concern of a  possible DVT, but Doppler studies have been done in the ER, and Dopplers  were negative for DVT.   PAST MEDICAL HISTORY:  1.  Hypothyroidism.  2.  Osteoporosis.   PAST SURGICAL HISTORY:  1.  Breast reduction 30 years ago.  2.  Laparoscopy appendectomy on September 10, 2005.   FAMILY HISTORY:  Noncontributory.   SOCIAL HISTORY:  No alcohol.  No drugs.  No tobacco.  [name].   DRUG ALLERGIES:  NKDA.   CURRENT MEDICATIONS:  1.  Synthroid 37.5 mcg daily.  2.  Fosamax weekly.  3.  Flagyl.  4.  Zofran.  5.  Tylenol #3.   REVIEW OF SYSTEMS:  As above.  In addition, patient also had an incidental  finding on last admission on CT of some hemangiomas or lesions on  the liver  that MRI was recommended to follow up.   PHYSICAL EXAMINATION:  GENERAL:  An acutely ill-appearing female patient  complaining of significant abdominal pain and intractable diarrhea.  VITAL SIGNS:  Temp 98.2, BP 157/68, pulse 59, respirations 20.  NECK:  Supple.  No adenopathy.  CHEST:  Bilateral lung sounds clear.  She is on room air.  CARDIAC:  S1 and S2.  No rubs, murmurs, thrills, no gallops.  ABDOMEN:  Soft.  Distended.  Decreased bowel sounds.  She is tender in the  right lower quadrant with minimal guarding.  She is tender in the right  upper quadrant .  No rebounding.  EXTREMITIES:  Symmetrical in appearance with no edema or clubbing.  Pulses  palpable.  In particular, her right leg that is bothering her, is not red or  swollen.  NEURO:  Patient is alert and oriented x3, moving all extremities x4.  No  focal deficits.  SKIN:  Patient has pale appearance.   LABS/X-RAY:  Pending except for the aforementioned Doppler studies of the  lower extremities that show no DVT.   IMPRESSION:  1.  Abdominal pain and diarrhea, post appendectomy.  Rule out abscess.  2.  Volume depletion suspected.  3.  Known hemangioma versus lesions on the liver with MRI recommended by      radiologist.   PLAN:  1.  Admit the patient to the surgical floor.  2.  Check CBC, CMET, amylase, and lipase.  3.  Check CT of the abdomen and pelvis.  Rule out abscess versus bowel      obstruction.  4.  IV fluid hydration.  5.  Dilaudid, Zofran, and Phenergan p.r.n.  6.  Change Synthroid to IV route and check a TSH.  7.  In addition, we will go ahead and repeat stool for C. diff and continue      empiric Flagyl 500 mg p.o. t.i.d.  8.  Dr. Ezzard Standing spoke with Dr. Claria Dice about consulting on Bridget [name] in a      non emergent fashion to address her diarrhea/abdominal complaints.      Allison L. Rennis Harding, N.P.      Sandria Bales. Ezzard Standing, M.D.  Electronically Signed    ALE/MEDQ  D:  09/18/2005  T:   09/18/2005  Job:  [id number]   cc:   Gretta Arab. Valentina Lucks, M.D.  Fax: [phone]   Llana Aliment. Malon Kindle., M.D.  Fax: [phone]   [place] Daphine Deutscher, MD  [address]  [place]  [place] [address]

## 2010-07-11 NOTE — Consult Note (Signed)
NAMESHEFALI, NG NO.:  [id number]   MEDICAL RECORD NO.:  [id number]          PATIENT TYPE:  INP   LOCATION:  5007                         FACILITY:  [facility]   PHYSICIAN:  Antonietta Breach, M.D.  DATE OF BIRTH:  October 22, 1950   DATE OF CONSULTATION:  DATE OF DISCHARGE:                                   CONSULTATION   REFERRING PHYSICIAN:  Sharlet Salina T. Hoxworth, M.D.   REASON FOR CONSULTATION:  Depression and anxiety in the context of prolonged  hospitalization.   HISTORY OF PRESENT ILLNESS:  [name] is a 60 year old married female  admitted to the [facility] on September 18, 2005 due to abdominal  pain, nausea, and excessive diarrhea.   [name] had undergone a laparoscopic appendectomy on September 10, 2005.  She  developed a postoperative abscess and has also developed a fistula.   The patient has also had a number of severe stressors in her social life.  Her mother-in-law recently passed away and her sister-in-law also passed  away after a rapid progression of a medical problem.  These have weighed  heavily on the patient's mind as she has had a prolonged recovery and  hospitalization.  She has not had any hallucinations or delusions.  She has  continued with insomnia, depressed mood, easy crying, low energy, and some  difficulty concentrating.  She also has had feeling on edge and muscle  tension with periods of much fear.   She has responded well to Ativan 0.5 mg t.i.d. p.r.n. for the feeling on  edge.  She is very discouraged by her decreased appetite and nausea because  she knows that she needs to maintain her nutrition for recovery.  She is  socially appropriate and cooperative.   PAST PSYCHIATRIC HISTORY:  The patient does not have a history of excessive  worry, panic, or obsessions or compulsions.  She has periodically received  short course of benzodiazepines from primary care physicians, when going  through menopause, for insomnia as well  as feeling on edge.  She has no  history of mania, no history of major depression, no history of  hallucinations or delusions.  She has no history of seeing a psychiatrist  and no history of psychiatric hospitalizations.   FAMILY PSYCHIATRIC HISTORY:  None.   SOCIAL HISTORY:  [name] has been married in a supportive marriage for  several decades.  She and her husband moved last year from [place] to  [place], [place] and then subsequently had been grieving the  losses as described above.  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  She has not history  of alcohol abuse and has no history of illegal drugs.  Children:  1 daughter  in [place], [place], who is very supportive and is planning on visiting  this weekend, although the patient has discouraged her daughter visiting,  somewhat, because the patient is concerned that the daughter will worry too  much.   GENERAL MEDICAL PROBLEMS:  As above.  Past surgical history  includes breast  reduction 30 years prior to admission.   MEDICATIONS:  The MAR is reviewed.  The patient is on no psychotropics  except for the Ativan as mentioned above, 0.5 to 1 mg q.6 hours p.r.n.   ALLERGIES:  NO KNOWN DRUG ALLERGIES.   LABORATORY DATA:  CBC is remarkable for a mildly decreased and stable  hemoglobin at 11.4.  The complete metabolic panel is remarkable for a  slightly SGOT at 52 and an increased SGPT at 73.  The patient's prealbumin  is within normal limits at 18.6.   REVIEW OF SYSTEMS:  CONSTITUTIONAL:  No head trauma  EYES:  No visual  changes.  EARS:  No hearing impairment.  NOSE:  No rhinorrhea.  MOUTH/THROAT:  No sore throat.  NEUROLOGIC:  Unremarkable.  PSYCHIATRIC:  As  above.  CARDIOVASCULAR:  No chest pain or palpations.  RESPIRATORY:  No  coughing or wheezing.  GASTROINTESTINAL:  Nausea as mentioned above.  No  vomiting and the diarrhea has subsided.   GENITOURINARY:  No dysuria.  SKIN:  Unremarkable.  ENDOCRINE/METABOLIC:  The patient has a history of  hypothyroidism and is currently on Synthroid 37.5 mcg daily.  HEMATOLOGIC/LYMPHATIC:  Unremarkable.   PHYSICAL EXAMINATION:  VITAL SIGNS:  Temperature 98.4, pulse 61,  respirations 18, blood pressure 122/63, O2 saturation on room air 97%.   The patient has utilized Ativan 0.5 mg 3 times today for feeling on edge  with adequate response, without adverse effect.   MENTAL STATUS EXAM:  [name] is an alert middle-age female lying in the  supine position in her hospital bed with good eye contact.  Her fund of  knowledge and intelligence are greater than average.  Her speech involves  normal rate and prosody.  Thought process is logical, coherent, goal  directed, no looseness of association.  Thought content:  No thoughts of  harming himself, or thoughts of harming others.  No delusions, no  hallucinations.  Mood is depressed.  Affect is constricted with intermittent  tears.  Memory is intact to immediate, recent, and remote.  Insight is good.  Judgment is intact.  She is oriented to the year, the month, the day of the  month, the day of the week, time of day, place, and person.   ASSESSMENT:  AXIS I:  1. Mood disorder, not otherwise specified.  293.83, depressed (functional      and general medical elements).  2. Rule out major depressive disorder, single episode, moderate to severe.  3. Anxiety disorder, not otherwise specified.  293.84.  AXIS II:  Deferred.  AXIS III:  See general medical problems.  AXIS IV:  General medical.  AXIS V:  55.   [name] is not at risk for harming herself or harming others.  She agrees  to call the emergency services for thoughts of harming herself or other  psychiatric emergent symptoms.   INFORMED CONSENT:  The indications, alternatives, and adverse effects of the following were discussed with the patient:  Remeron for anti-depression as  well  as some potential anti-acute anxiety benefit and the benefit of the  productive side effects of appetite stimulation, anti-nausea, and anti-  insomnia; Ativan for acute anti-anxiety, including the risk of dependence.   The patient understands the above information and wants to start Remeron and  continue Ativan judiciously with a goal of eventually discontinuing Ativan.  She also is open to the possibility of an SSRI if the anxiety lingers and is  more than just  an acute reactive symptom.   The patient was given ego-supportive therapy and an introduction to  cognitive behavioral therapy with instructions on confronting cognitive  distortions and the typical nature of cognitive distortions, particularly in  the context of cognitions regarding the future that have been distorted  recently (increased tendency to catastrophize future prognosis due to recent  deaths in the family).   DISCUSSION:  Remeron can provide anti-5HT3 benefits like Zofran for anti-  nausea.  Remeron can block 5HT2 receptors as well as stimulate appetite and  help with insomnia through antihistaminic properties.  Remeron will also  help with energy through alpha-2 blockade and increased norepinephrine  release.   RECOMMENDATIONS:  1. Start Remeron at 7.5 mg test dose p.o. q.h.s. if tolerated, then      titrate by 7.5 mg per day to the initial trial dose of 22.5 mg p.o.      q.h.s.  2. Continue the Ativan 0.5 mg q.6 hours p.r.n. anxiety.  3. The undersigned will follow and continue the cognitive behavioral      therapy reinforcement and training, as well as ego-supportive therapy,      and will also discuss deep breathing and progressive muscle relaxation.  4. Concerning outpatient psychiatric care, it is anticipated that the      patient could benefit from outpatient psychotherapy and potentially      outpatient psychotropic medication management continuing the above      regimen.      Antonietta Breach, M.D.   Electronically Signed     JW/MEDQ  D:  10/06/2005  T:  10/07/2005  Job:  [id number]

## 2010-07-11 NOTE — Op Note (Signed)
Bridget [name], [name]               ACCOUNT NO.:  [id number]   MEDICAL RECORD NO.:  [id number]          PATIENT TYPE:  INP   LOCATION:  5007                         FACILITY:  [facility]   PHYSICIAN:  Shirley Friar, MDDATE OF BIRTH:  05-21-1950   DATE OF PROCEDURE:  09/28/2005  DATE OF DISCHARGE:                                 OPERATIVE REPORT   PROCEDURE:  Flexible sigmoidoscopy.   INDICATIONS:  Diarrhea.   MEDICATIONS:  Fentanyl 80 mcg IV, Versed 5 mg IV.   FINDINGS:  Rectal exam was normal.  A pediatric adjustable colonoscope was  inserted into the colon and advanced to 50 cm which was noted to be her  splenic flexure.  On careful withdrawal the colonoscope revealed normal-  appearing mucosa without any pseudomembranes present.  Semi-liquid, semi-  solid stool noted scattered in the distal colon and a sample this was  collected for further analysis.  Retroflexion was unremarkable.   ASSISTANT:  Normal flexible sigmoidoscopy (no pseudomembranes).   PLANS:  Stool collected for culture, O&P, fecal white blood cells, fecal fat  stain.      Shirley Friar, MD  Electronically Signed     VCS/MEDQ  D:  09/28/2005  T:  09/28/2005  Job:  [id number]   cc:   Currie Paris, M.D.

## 2010-09-19 ENCOUNTER — Other Ambulatory Visit: Payer: Self-pay | Admitting: Certified Nurse Midwife

## 2010-09-19 DIAGNOSIS — Z1231 Encounter for screening mammogram for malignant neoplasm of breast: Secondary | ICD-10-CM

## 2010-10-02 ENCOUNTER — Ambulatory Visit (HOSPITAL_COMMUNITY)
Admission: RE | Admit: 2010-10-02 | Discharge: 2010-10-02 | Disposition: A | Discharge status: Home / Self Care | Payer: Federal, State, Local not specified - PPO | Source: Ambulatory Visit | Attending: Certified Nurse Midwife | Admitting: Certified Nurse Midwife

## 2010-10-02 DIAGNOSIS — Z1231 Encounter for screening mammogram for malignant neoplasm of breast: Secondary | ICD-10-CM

## 2010-11-28 DIAGNOSIS — M19079 Primary osteoarthritis, unspecified ankle and foot: Secondary | ICD-10-CM | POA: Insufficient documentation

## 2011-07-28 ENCOUNTER — Emergency Department (HOSPITAL_COMMUNITY)
Admission: EM | Admit: 2011-07-28 | Discharge: 2011-07-28 | Disposition: A | Discharge status: Home / Self Care | Payer: Federal, State, Local not specified - PPO | Attending: Emergency Medicine | Admitting: Emergency Medicine

## 2011-07-28 ENCOUNTER — Emergency Department (HOSPITAL_COMMUNITY): Payer: Federal, State, Local not specified - PPO

## 2011-07-28 ENCOUNTER — Encounter (HOSPITAL_COMMUNITY): Payer: Self-pay | Admitting: Emergency Medicine

## 2011-07-28 DIAGNOSIS — R079 Chest pain, unspecified: Secondary | ICD-10-CM | POA: Insufficient documentation

## 2011-07-28 DIAGNOSIS — E039 Hypothyroidism, unspecified: Secondary | ICD-10-CM | POA: Insufficient documentation

## 2011-07-28 DIAGNOSIS — M549 Dorsalgia, unspecified: Secondary | ICD-10-CM

## 2011-07-28 DIAGNOSIS — I10 Essential (primary) hypertension: Secondary | ICD-10-CM | POA: Insufficient documentation

## 2011-07-28 HISTORY — DX: Essential (primary) hypertension: I10

## 2011-07-28 HISTORY — DX: Hypothyroidism, unspecified: E03.9

## 2011-07-28 LAB — CBC
Hemoglobin: 13.4 g/dL (ref 12.0–15.0)
MCH: 27.5 pg (ref 26.0–34.0)
MCHC: 34.3 g/dL (ref 30.0–36.0)
MCV: 80.3 fL (ref 78.0–100.0)
RBC: 4.87 MIL/uL (ref 3.87–5.11)

## 2011-07-28 LAB — DIFFERENTIAL
Eosinophils Absolute: 0.2 10*3/uL (ref 0.0–0.7)
Eosinophils Relative: 3 % (ref 0–5)
Lymphs Abs: 2 10*3/uL (ref 0.7–4.0)
Monocytes Absolute: 0.4 10*3/uL (ref 0.1–1.0)
Monocytes Relative: 7 % (ref 3–12)

## 2011-07-28 LAB — BASIC METABOLIC PANEL
BUN: 17 mg/dL (ref 6–23)
Calcium: 9.5 mg/dL (ref 8.4–10.5)
Creatinine, Ser: 0.82 mg/dL (ref 0.50–1.10)
GFR calc non Af Amer: 75 mL/min — ABNORMAL LOW (ref >90)
Glucose, Bld: 102 mg/dL — ABNORMAL HIGH (ref 70–99)

## 2011-07-28 MED ORDER — SODIUM CHLORIDE 0.9 % IV SOLN
Freq: Once | INTRAVENOUS | Status: AC
  Administered 2011-07-28: 07:00:00 via INTRAVENOUS

## 2011-07-28 MED ORDER — MORPHINE SULFATE 4 MG/ML IJ SOLN
INTRAMUSCULAR | Status: AC
  Filled 2011-07-28: qty 1

## 2011-07-28 MED ORDER — DIAZEPAM 5 MG/ML IJ SOLN
5.0000 mg | Freq: Once | INTRAMUSCULAR | Status: AC
  Administered 2011-07-28: 5 mg via INTRAVENOUS
  Filled 2011-07-28: qty 2

## 2011-07-28 MED ORDER — MORPHINE SULFATE 4 MG/ML IJ SOLN
4.0000 mg | Freq: Once | INTRAMUSCULAR | Status: AC
  Administered 2011-07-28: 4 mg via INTRAVENOUS

## 2011-07-28 MED ORDER — DIAZEPAM 5 MG PO TABS: 5.0000 mg | ORAL_TABLET | Freq: Three times a day (TID) | ORAL | Status: AC | PRN

## 2011-07-28 MED ORDER — MORPHINE SULFATE 4 MG/ML IJ SOLN
4.0000 mg | Freq: Once | INTRAMUSCULAR | Status: AC
  Administered 2011-07-28: 4 mg via INTRAVENOUS
  Filled 2011-07-28: qty 1

## 2011-07-28 MED ORDER — KETOROLAC TROMETHAMINE 30 MG/ML IJ SOLN
30.0000 mg | Freq: Once | INTRAMUSCULAR | Status: AC
  Administered 2011-07-28: 30 mg via INTRAVENOUS
  Filled 2011-07-28: qty 1

## 2011-07-28 MED ORDER — OXYCODONE-ACETAMINOPHEN 5-325 MG PO TABS: 1.0000 | ORAL_TABLET | ORAL | Status: AC | PRN

## 2011-07-28 NOTE — ED Notes (Signed)
Patient states that she fell getting out of a bathtub on Saturday; was seen at Urgent Care and had an x-ray.  Patient was seen by her PCP and given a shot of Toradol, which provided relief for only a minimal amount of time.  Patient complaining of right lower back pain that radiates around her side; patient states that the pain makes it difficult for her to get comfortable in bed and ambulate.

## 2011-07-28 NOTE — ED Provider Notes (Signed)
History     CSN: [id number]  Arrival date & time 07/28/11  4696   First MD Initiated Contact with Patient 07/28/11 [phone]      Chief Complaint  Patient presents with  . Flank Pain    (Consider location/radiation/quality/duration/timing/severity/associated sxs/prior treatment) Patient is a 61 y.o. female presenting with flank pain. The history is provided by the patient.  Flank Pain This is a new problem. The current episode started in the past 7 days. The problem occurs constantly. The problem has been unchanged. Pertinent negatives include no abdominal pain, chest pain, coughing, fever, nausea or neck pain. Associated symptoms comments: She fell 4 days ago to a sitting position without hitting her back against anything else. Since that time she has had pain in the right upper back that is sharp and unrelenting. It is worse with movement. No cough or SOB, but pain is increased with breathing. No abdominal pain, nausea or vomiting. She denies midline back pain or neck pain. . Treatments tried: She has been seen at an Urgent Care and by her PCP. She has been taking Flexeril and Vicodin without relief.    Past Medical History  Diagnosis Date  . Hypothyroid   . Hypertension   . Arthritis     Past Surgical History  Procedure Date  . Appendectomy     History reviewed. No pertinent family history.  History  Substance Use Topics  . Smoking status: Never Smoker   . Smokeless tobacco: Not on file  . Alcohol Use: Yes     Occassional Use    OB History    Grav Para Term Preterm Abortions TAB SAB Ect Mult Living                  Review of Systems  Constitutional: Negative for fever.  HENT: Negative for neck pain.   Respiratory: Negative for cough and shortness of breath.   Cardiovascular: Negative for chest pain.  Gastrointestinal: Negative for nausea and abdominal pain.  Musculoskeletal: Positive for back pain.    Allergies  Lisinopril; Other; and Vicodin  Home Medications    Current Outpatient Rx  Name Route Sig Dispense Refill  . ACETAMINOPHEN 500 MG PO TABS Oral Take 1,000 mg by mouth every 6 (six) hours as needed. For pain    . ASPIRIN EC 81 MG PO TBEC Oral Take 81 mg by mouth daily.    . CYCLOBENZAPRINE HCL 10 MG PO TABS Oral Take 10 mg by mouth 3 (three) times daily as needed. For pain    . HYDROCHLOROTHIAZIDE 25 MG PO TABS Oral Take 25 mg by mouth daily.    Marland Kitchen LEVOTHYROXINE SODIUM 75 MCG PO TABS Oral Take 75 mcg by mouth daily.    Marland Kitchen LORAZEPAM 1 MG PO TABS Oral Take 0.5 mg by mouth every 8 (eight) hours as needed. For anxiety    . ADULT MULTIVITAMIN W/MINERALS CH Oral Take 1 tablet by mouth daily.      BP 161/63  Pulse 59  Temp(Src) 97.6 F (36.4 C) (Oral)  Resp 16  SpO2 99%  Physical Exam  Constitutional: She is oriented to person, place, and time. She appears well-developed and well-nourished. No distress.       Uncomfortable in appearance.  HENT:  Head: Atraumatic.  Neck: Normal range of motion.  Cardiovascular: Normal rate and regular rhythm.   No murmur heard. Pulmonary/Chest: Effort normal. She has no wheezes. She has no rales. She exhibits no tenderness.  Abdominal: Soft. There is no  tenderness.  Genitourinary:       No CVA tenderness.  Musculoskeletal:       Back is unremarkable in appearance without swelling or discoloration. Point tenderness at right inferior border scapula. No midline thoracic or lumbar tenderness.   Neurological: She is alert and oriented to person, place, and time.  Skin: Skin is warm and dry.    ED Course  Procedures (including critical care time)   Labs Reviewed  CBC  DIFFERENTIAL  BASIC METABOLIC PANEL   No results found. Results for orders placed during the hospital encounter of 07/28/11  CBC      Component Value Range   WBC 6.8  4.0 - 10.5 (K/uL)   RBC 4.87  3.87 - 5.11 (MIL/uL)   Hemoglobin 13.4  12.0 - 15.0 (g/dL)   HCT 62.9  52.8 - 41.3 (%)   MCV 80.3  78.0 - 100.0 (fL)   MCH 27.5  26.0 -  34.0 (pg)   MCHC 34.3  30.0 - 36.0 (g/dL)   RDW 24.4  01.0 - 27.2 (%)   Platelets 276  150 - 400 (K/uL)  DIFFERENTIAL      Component Value Range   Neutrophils Relative 62  43 - 77 (%)   Neutro Abs 4.2  1.7 - 7.7 (K/uL)   Lymphocytes Relative 29  12 - 46 (%)   Lymphs Abs 2.0  0.7 - 4.0 (K/uL)   Monocytes Relative 7  3 - 12 (%)   Monocytes Absolute 0.4  0.1 - 1.0 (K/uL)   Eosinophils Relative 3  0 - 5 (%)   Eosinophils Absolute 0.2  0.0 - 0.7 (K/uL)   Basophils Relative 0  0 - 1 (%)   Basophils Absolute 0.0  0.0 - 0.1 (K/uL)  BASIC METABOLIC PANEL      Component Value Range   Sodium 144  135 - 145 (mEq/L)   Potassium 3.6  3.5 - 5.1 (mEq/L)   Chloride 105  96 - 112 (mEq/L)   CO2 29  19 - 32 (mEq/L)   Glucose, Bld 102 (*) 70 - 99 (mg/dL)   BUN 17  6 - 23 (mg/dL)   Creatinine, Ser 5.36  0.50 - 1.10 (mg/dL)   Calcium 9.5  8.4 - 64.4 (mg/dL)   GFR calc non Af Amer 75 (*) >90 (mL/min)   GFR calc Af Amer 87 (*) >90 (mL/min)   Dg Ribs Unilateral W/chest Right  07/28/2011  *RADIOLOGY REPORT*  Clinical Data: Post fall, now with posterior rib pain  RIGHT RIBS AND CHEST - 3+ VIEW  Comparison: 05/19/2010; 09/24/2005  Findings:  Grossly unchanged cardiac silhouette and mediastinal contours. Lung volumes are reduced.  Interval development of small bilateral effusions and bibasilar opacities, right greater than left.  No pneumothorax.  No acute osseous abnormalities, specifically, no displaced rib fractures.  IMPRESSION: 1.  Decreased lung volumes with small bilateral effusions and bibasilar opacities, atelectasis versus infiltrate. A follow-up chest radiograph in 4 to 6 weeks after treatment is recommended to ensure resolution. 2.  No displaced rib fractures.  Original Report Authenticated By: Waynard Reeds, M.D.    No diagnosis found.  1. Musculoskeletal back pain  MDM  Pain is improved. X-ray reveiwed with Dr. Weldon Inches and found to be negative for injury. She states she is ready for  discharge.        Rodena Medin, PA-C 07/28/11 [phone]

## 2011-07-28 NOTE — Discharge Instructions (Signed)
CONTINUE TO REST AND TAKE MEDICATIONS AS PRESCRIBED. FOLLOW UP WITH MURPHY WAINER IF PAIN DOES NOT IMPROVE AND RETURN HERE WITH ANY WORSENING SYMPTOMS OR NEW CONCERN.

## 2011-07-29 NOTE — ED Provider Notes (Signed)
Medical screening examination/treatment/procedure(s) were performed by non-physician practitioner and as supervising physician I was immediately available for consultation/collaboration.  Sunnie Nielsen, MD 07/29/11 [phone]

## 2011-09-28 ENCOUNTER — Other Ambulatory Visit: Payer: Self-pay | Admitting: Obstetrics & Gynecology

## 2011-09-28 DIAGNOSIS — Z1231 Encounter for screening mammogram for malignant neoplasm of breast: Secondary | ICD-10-CM

## 2011-10-09 ENCOUNTER — Ambulatory Visit
Admission: RE | Admit: 2011-10-09 | Discharge: 2011-10-09 | Disposition: A | Discharge status: Home / Self Care | Payer: Federal, State, Local not specified - PPO | Source: Ambulatory Visit | Attending: Obstetrics & Gynecology | Admitting: Obstetrics & Gynecology

## 2011-10-09 DIAGNOSIS — Z1231 Encounter for screening mammogram for malignant neoplasm of breast: Secondary | ICD-10-CM

## 2012-06-15 ENCOUNTER — Other Ambulatory Visit: Payer: Self-pay | Admitting: Dermatology

## 2012-08-17 ENCOUNTER — Emergency Department (HOSPITAL_COMMUNITY): Payer: Federal, State, Local not specified - PPO

## 2012-08-17 ENCOUNTER — Emergency Department (HOSPITAL_COMMUNITY)
Admission: EM | Admit: 2012-08-17 | Discharge: 2012-08-17 | Discharge status: Against Medical Advice | Payer: Federal, State, Local not specified - PPO | Attending: Emergency Medicine | Admitting: Emergency Medicine

## 2012-08-17 ENCOUNTER — Encounter (HOSPITAL_COMMUNITY): Payer: Self-pay | Admitting: *Deleted

## 2012-08-17 DIAGNOSIS — I10 Essential (primary) hypertension: Secondary | ICD-10-CM | POA: Insufficient documentation

## 2012-08-17 DIAGNOSIS — R079 Chest pain, unspecified: Secondary | ICD-10-CM | POA: Insufficient documentation

## 2012-08-17 LAB — BASIC METABOLIC PANEL
Calcium: 10 mg/dL (ref 8.4–10.5)
GFR calc non Af Amer: 73 mL/min — ABNORMAL LOW (ref >90)
Glucose, Bld: 128 mg/dL — ABNORMAL HIGH (ref 70–99)
Sodium: 143 mEq/L (ref 135–145)

## 2012-08-17 LAB — CBC
Hemoglobin: 13.3 g/dL (ref 12.0–15.0)
MCH: 27 pg (ref 26.0–34.0)
MCHC: 34 g/dL (ref 30.0–36.0)
Platelets: 316 10*3/uL (ref 150–400)
RDW: 13.7 % (ref 11.5–15.5)

## 2012-08-17 LAB — POCT I-STAT TROPONIN I

## 2012-08-17 NOTE — ED Notes (Signed)
Pt in c/o chest pain since 1pm, states pain is radiating up into neck, c/o shortness of breath, denies n/v, pt states the pain is constant and describes as pressure.

## 2012-08-17 NOTE — ED Notes (Signed)
Pt sts she is leaving 

## 2012-09-26 ENCOUNTER — Encounter: Payer: Self-pay | Admitting: Certified Nurse Midwife

## 2012-09-27 ENCOUNTER — Encounter: Payer: Self-pay | Admitting: Certified Nurse Midwife

## 2012-09-27 ENCOUNTER — Ambulatory Visit (INDEPENDENT_AMBULATORY_CARE_PROVIDER_SITE_OTHER): Payer: Federal, State, Local not specified - PPO | Admitting: Certified Nurse Midwife

## 2012-09-27 VITALS — BP 112/60 | HR 64 | Resp 16 | Ht 60.5 in | Wt 161.0 lb

## 2012-09-27 DIAGNOSIS — Z01419 Encounter for gynecological examination (general) (routine) without abnormal findings: Secondary | ICD-10-CM

## 2012-09-27 NOTE — Progress Notes (Signed)
62 y.o. G26P1001 Married Caucasian Fe here for annual exam.  Menopausal no HRT, denies vaginal bleeding or vaginal dryness issues. Complaining of persistent cough for the past 4 days.No fever or chills, taking OTC cough medicine. 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 Recent visit with Dr. Romero Belling with change in thyroid medication. Sees PCP for aex and labs. No other health issues.  Patient's last menstrual period was 02/23/1993.          Sexually active: yes  The current method of family planning is none.    Exercising: yes  walking Smoker:  no  Health Maintenance: Pap: 09-25-11 neg HPV HR neg MMG:  10-09-11 normal Colonoscopy:  2012 negative 5 years. BMD:   2012 TDaP:  2006 Labs: none Self breast exam: done occ   reports that she has never smoked. She does not have any smokeless tobacco history on file. She reports that she does not drink alcohol or use illicit drugs.  Past Medical History  Diagnosis Date  . Hypothyroid   . Hypertension   . Osteoporosis   . Kidney stones   . Heart murmur     Past Surgical History  Procedure Laterality Date  . Appendectomy    . Breast biopsy Left     benign  . Breast reduction surgery    . Small bowel injury      fistula  . Ganglion cyst excision  4/12    left foot    Current Outpatient Prescriptions  Medication Sig Dispense Refill  . acetaminophen (TYLENOL) 500 MG tablet Take 1,000 mg by mouth every 6 (six) hours as needed. For pain      . aspirin EC 81 MG tablet Take 81 mg by mouth daily.      . cetirizine (ZYRTEC) 10 MG tablet Take 10 mg by mouth as needed.       Tery Sanfilippo Sodium (COLACE PO) Take by mouth as needed.      . hydrochlorothiazide (MICROZIDE) 12.5 MG capsule Take 12.5 mg by mouth daily.      Marland Kitchen levothyroxine (SYNTHROID, LEVOTHROID) 88 MCG tablet Take 88 mcg by mouth daily before breakfast.      . LORazepam (ATIVAN) 1 MG tablet Take 0.5 mg by mouth every 8 (eight) hours as needed. For anxiety      . MELATONIN PO Take by  mouth at bedtime as needed.       No current facility-administered medications for this visit.    Family History  Problem Relation Age of Onset  . Breast cancer Mother   . Cancer Brother     leukemia    ROS:  Pertinent items are noted in HPI.  Otherwise, a comprehensive ROS was negative.  Exam:   BP 112/60  Pulse 64  Resp 16  Ht 5' 0.5" (1.537 m)  Wt 161 lb (73.029 kg)  BMI 30.91 kg/m2  LMP 02/23/1993 Height: 5' 0.5" (153.7 cm)  Ht Readings from Last 3 Encounters:  09/27/12 5' 0.5" (1.537 m)  11/12/08 5' (1.524 m)  07/02/08 5' (1.524 m)    General appearance: alert, cooperative and appears stated age Head: Normocephalic, without obvious abnormality, atraumatic Neck: no adenopathy, supple, symmetrical, trachea midline and thyroid normal to inspection and palpation Lungs: clear to auscultation bilaterally Breasts: normal appearance, no masses or tenderness, No nipple retraction or dimpling, No nipple discharge or bleeding, No axillary or supraclavicular adenopathy Heart: regular rate and rhythm Abdomen: soft, non-tender; no masses,  no organomegaly Extremities: extremities normal,  atraumatic, no cyanosis or edema Skin: Skin color, texture, turgor normal. No rashes or lesions Lymph nodes: Cervical, supraclavicular, and axillary nodes normal. No abnormal inguinal nodes palpated Neurologic: Grossly normal   Pelvic: External genitalia:  no lesions              Urethra:  normal appearing urethra with no masses, tenderness or lesions              Bartholin's and Skene's: normal                 Vagina: normal appearing vagina with normal color and discharge, no lesions              Cervix: normal, non tender              Pap taken: no Bimanual Exam:  Uterus:  normal size, contour, position, consistency, mobility, non-tender and anteflexed              Adnexa: normal adnexa and no mass, fullness, tenderness               Rectovaginal: Confirms               Anus:  normal  sphincter tone, no lesions  A:  Well Woman with normal exam  Menopausal no HRT  Cough? Viral vs allergy related, lungs clear  Hypothyroid medication decreased managed by Endocrine    P:   Reviewed health and wellness pertinent to exam  Reviewed need to advise if vaginal bleeding.  Continue OTC for cough, salt water gargle for mucous reduction, vaporizer when sleeping.  See PCP if not resolving or symptoms increase. Patient agreeable  Continue follow up as indicated.  Pap smear as per guidelines   Mammogram yearly due this month, will schedule pap smear not taken  Today  counseled on breast self exam, mammography screening, menopause, adequate intake of calcium and vitamin D, diet and exercise, Kegel's exercises return annually or prn  An After Visit Summary was printed and given to the patient.

## 2012-09-27 NOTE — Patient Instructions (Signed)

## 2012-09-29 NOTE — Progress Notes (Signed)
Note reviewed, agree with plan.  Jermery Caratachea, MD  

## 2012-10-04 ENCOUNTER — Other Ambulatory Visit: Payer: Self-pay

## 2012-10-04 DIAGNOSIS — Z1231 Encounter for screening mammogram for malignant neoplasm of breast: Secondary | ICD-10-CM

## 2012-10-20 ENCOUNTER — Ambulatory Visit
Admission: RE | Admit: 2012-10-20 | Discharge: 2012-10-20 | Disposition: A | Discharge status: Home / Self Care | Payer: Federal, State, Local not specified - PPO | Source: Ambulatory Visit

## 2012-10-20 DIAGNOSIS — Z1231 Encounter for screening mammogram for malignant neoplasm of breast: Secondary | ICD-10-CM

## 2013-01-03 ENCOUNTER — Encounter: Payer: Self-pay | Admitting: Certified Nurse Midwife

## 2013-02-28 ENCOUNTER — Encounter: Payer: Self-pay | Admitting: Cardiology

## 2013-02-28 ENCOUNTER — Ambulatory Visit (INDEPENDENT_AMBULATORY_CARE_PROVIDER_SITE_OTHER): Payer: Federal, State, Local not specified - PPO | Admitting: Cardiology

## 2013-02-28 VITALS — BP 110/64 | HR 63 | Ht 60.0 in | Wt 160.0 lb

## 2013-02-28 DIAGNOSIS — I208 Other forms of angina pectoris: Secondary | ICD-10-CM

## 2013-02-28 NOTE — Patient Instructions (Signed)
Your physician recommends that you continue on your current medications as directed. Please refer to the Current Medication list given to you today.  Your physician wants you to follow-up in: 1 year with Dr. Skains. You will receive a reminder letter in the mail two months in advance. If you don't receive a letter, please call our office to schedule the follow-up appointment.  

## 2013-02-28 NOTE — Progress Notes (Signed)
[facility]. [address], [address], [place]  [address] Phone: [phone] Fax:  [phone]  Date:  02/28/2013   ID:  Bridget [name], DOB 01/14/1951, MRN [id number]  PCP:  Osborne Casco, MD   History of Present Illness: Bridget [name] is a 63 y.o. female in the emergency department for chest discomfort on 08/17/12 here for followup. Started at 1pm at home. Was 4 pm when finally had ECG. Felt like a pressure, not burning. Radiating tightness in jaw (can feel this also with slight incline). Relieved with rest in general. This pain went away with time. Concerned. Stress. ?GERD. Her lab work was unremarkable, normal troponin, normal hemoglobin of 13.3, normal creatinine of 0.83. Her glucose was mildly elevated at 128. Her EKG showed sinus bradycardia rate 59 with nonspecific ST changes. No obvious ST segment depression noted. Personally reviewed. Chest x-ray also showed no active disease. She states that she was there for approximately 4 hours and decided to leave. She was unhappy with her wait time. Today she is feeling better. The day following her chest discomfort she did feel some mild soreness to her chest wall. She had a cardiac catheterization in 2012 which was normal.  02/28/13 - still noticing some jaw tightness and shoulders with hill. Anxious with it.     Wt Readings from Last 3 Encounters:  02/28/13 160 lb (72.576 kg)  09/27/12 161 lb (73.029 kg)  08/17/12 162 lb (73.483 kg)     Past Medical History  Diagnosis Date  . Hypothyroid   . Hypertension   . Kidney stones   . Heart murmur   . Osteoporosis     & osteopenia    Past Surgical History  Procedure Laterality Date  . Appendectomy    . Breast biopsy Left     benign  . Breast reduction surgery    . Small bowel injury      fistula  . Ganglion cyst excision  4/12    left foot & left arm    Current Outpatient Prescriptions  Medication Sig Dispense Refill  . acetaminophen (TYLENOL) 500 MG tablet Take  1,000 mg by mouth every 6 (six) hours as needed. For pain      . aspirin EC 81 MG tablet Take 81 mg by mouth daily.      . cetirizine (ZYRTEC) 10 MG tablet Take 10 mg by mouth as needed.       Mariane Baumgarten Sodium (COLACE PO) Take by mouth as needed.      . hydrochlorothiazide (MICROZIDE) 12.5 MG capsule Take 12.5 mg by mouth daily.      Marland Kitchen levothyroxine (SYNTHROID, LEVOTHROID) 88 MCG tablet Take 88 mcg by mouth daily before breakfast.      . LORazepam (ATIVAN) 1 MG tablet Take 0.5 mg by mouth every 8 (eight) hours as needed. For anxiety      . MELATONIN PO Take by mouth at bedtime as needed.       No current facility-administered medications for this visit.    Allergies:    Allergies  Allergen Reactions  . Lisinopril Other (See Comments)    cough  . Vicodin [Hydrocodone-Acetaminophen] Other (See Comments)    Made her feel off    Social History:  The patient  reports that she has never smoked. She does not have any smokeless tobacco history on file. She reports that she does not drink alcohol or use illicit drugs.   ROS:  Please see the history of  present illness.     PHYSICAL EXAM: VS:  BP 110/64  Pulse 63  Ht 5' (1.524 m)  Wt 160 lb (72.576 kg)  BMI 31.25 kg/m2  LMP 02/23/1993 Well nourished, well developed, in no acute distress HEENT: normal Neck: no JVD Cardiac:  normal S1, S2; RRR; no murmur Lungs:  clear to auscultation bilaterally, no wheezing, rhonchi or rales Abd: soft, nontender, no hepatomegaly Ext: no edema Skin: warm and dry Neuro: no focal abnormalities noted  EKG:  None today, previously unremarkable.  ASSESSMENT AND PLAN:  1. Angina/chest pain-reassuring fact is that in 2012 with similar symptoms, cardiac catheterization was normal. No obvious coronary artery disease. Previously we did discuss the possibility of pursuing nuclear stress test as coronary disease and change. She respectfully declined. I also offered her isosorbide or perhaps beta blocker.  Declined as well. If her symptoms worsen or become more worrisome, she knows to contact me.  Signed, Candee Furbish, MD [facility]  02/28/2013 10:31 AM

## 2013-07-23 ENCOUNTER — Emergency Department (HOSPITAL_COMMUNITY): Payer: Federal, State, Local not specified - PPO

## 2013-07-23 ENCOUNTER — Emergency Department (HOSPITAL_COMMUNITY)
Admission: EM | Admit: 2013-07-23 | Discharge: 2013-07-23 | Disposition: A | Discharge status: Home / Self Care | Payer: Federal, State, Local not specified - PPO | Attending: Emergency Medicine | Admitting: Emergency Medicine

## 2013-07-23 ENCOUNTER — Encounter (HOSPITAL_COMMUNITY): Payer: Self-pay | Admitting: Emergency Medicine

## 2013-07-23 DIAGNOSIS — Y939 Activity, unspecified: Secondary | ICD-10-CM | POA: Insufficient documentation

## 2013-07-23 DIAGNOSIS — E039 Hypothyroidism, unspecified: Secondary | ICD-10-CM | POA: Insufficient documentation

## 2013-07-23 DIAGNOSIS — Z87442 Personal history of urinary calculi: Secondary | ICD-10-CM | POA: Insufficient documentation

## 2013-07-23 DIAGNOSIS — S92309A Fracture of unspecified metatarsal bone(s), unspecified foot, initial encounter for closed fracture: Secondary | ICD-10-CM | POA: Insufficient documentation

## 2013-07-23 DIAGNOSIS — X500XXA Overexertion from strenuous movement or load, initial encounter: Secondary | ICD-10-CM | POA: Insufficient documentation

## 2013-07-23 DIAGNOSIS — S8263XA Displaced fracture of lateral malleolus of unspecified fibula, initial encounter for closed fracture: Secondary | ICD-10-CM | POA: Insufficient documentation

## 2013-07-23 DIAGNOSIS — I10 Essential (primary) hypertension: Secondary | ICD-10-CM | POA: Insufficient documentation

## 2013-07-23 DIAGNOSIS — R011 Cardiac murmur, unspecified: Secondary | ICD-10-CM | POA: Insufficient documentation

## 2013-07-23 DIAGNOSIS — Z7982 Long term (current) use of aspirin: Secondary | ICD-10-CM | POA: Insufficient documentation

## 2013-07-23 DIAGNOSIS — S92352A Displaced fracture of fifth metatarsal bone, left foot, initial encounter for closed fracture: Secondary | ICD-10-CM

## 2013-07-23 DIAGNOSIS — Z8739 Personal history of other diseases of the musculoskeletal system and connective tissue: Secondary | ICD-10-CM | POA: Insufficient documentation

## 2013-07-23 DIAGNOSIS — S82402A Unspecified fracture of shaft of left fibula, initial encounter for closed fracture: Secondary | ICD-10-CM

## 2013-07-23 DIAGNOSIS — W108XXA Fall (on) (from) other stairs and steps, initial encounter: Secondary | ICD-10-CM | POA: Insufficient documentation

## 2013-07-23 DIAGNOSIS — M25572 Pain in left ankle and joints of left foot: Secondary | ICD-10-CM

## 2013-07-23 DIAGNOSIS — Z79899 Other long term (current) drug therapy: Secondary | ICD-10-CM | POA: Insufficient documentation

## 2013-07-23 DIAGNOSIS — Y929 Unspecified place or not applicable: Secondary | ICD-10-CM | POA: Insufficient documentation

## 2013-07-23 MED ORDER — KETOROLAC TROMETHAMINE 60 MG/2ML IM SOLN
30.0000 mg | Freq: Once | INTRAMUSCULAR | Status: AC
  Administered 2013-07-23: 30 mg via INTRAMUSCULAR
  Filled 2013-07-23: qty 2

## 2013-07-23 NOTE — ED Notes (Signed)
Patient transported to X-ray 

## 2013-07-23 NOTE — Discharge Instructions (Signed)
Wear a Cam Walker at all times. Use crutches to avoid from bearing weight on your left ankle and foot. If you need to bear weight, you may do touch toe weight-bearing only. Recommend elevation as much as possible as well as ibuprofen 600 mg every 6 hours for pain control as needed. Return if symptoms worsen.  RICE: Routine Care for Injuries The routine care of many injuries includes Rest, Ice, Compression, and Elevation (RICE). HOME CARE INSTRUCTIONS  Rest is needed to allow your body to heal. Routine activities can usually be resumed when comfortable. Injured tendons and bones can take up to 6 weeks to heal. Tendons are the cord-like structures that attach muscle to bone.  Ice following an injury helps keep the swelling down and reduces pain.  Put ice in a plastic bag.  Place a towel between your skin and the bag.  Leave the ice on for 15-20 minutes, 03-04 times a day. Do this while awake, for the first 24 to 48 hours. After that, continue as directed by your caregiver.  Compression helps keep swelling down. It also gives support and helps with discomfort. If an elastic bandage has been applied, it should be removed and reapplied every 3 to 4 hours. It should not be applied tightly, but firmly enough to keep swelling down. Watch fingers or toes for swelling, bluish discoloration, coldness, numbness, or excessive pain. If any of these problems occur, remove the bandage and reapply loosely. Contact your caregiver if these problems continue.  Elevation helps reduce swelling and decreases pain. With extremities, such as the arms, hands, legs, and feet, the injured area should be placed near or above the level of the heart, if possible. SEEK IMMEDIATE MEDICAL CARE IF:  You have persistent pain and swelling.  You develop redness, numbness, or unexpected weakness.  Your symptoms are getting worse rather than improving after several days. These symptoms may indicate that further evaluation or  further X-rays are needed. Sometimes, X-rays may not show a small broken bone (fracture) until 1 week or 10 days later. Make a follow-up appointment with your caregiver. Ask when your X-ray results will be ready. Make sure you get your X-ray results. Document Released: 05/24/2000 Document Revised: 05/04/2011 Document Reviewed: 07/11/2010 [facility] Patient Information 2014 [place], [place].  Cast or Splint Care Casts and splints support injured limbs and keep bones from moving while they heal. It is important to care for your cast or splint at home.  HOME CARE INSTRUCTIONS  Keep the cast or splint uncovered during the drying period. It can take 24 to 48 hours to dry if it is made of plaster. A fiberglass cast will dry in less than 1 hour.  Do not rest the cast on anything harder than a pillow for the first 24 hours.  Do not put weight on your injured limb or apply pressure to the cast until your health care provider gives you permission.  Keep the cast or splint dry. Wet casts or splints can lose their shape and may not support the limb as well. A wet cast that has lost its shape can also create harmful pressure on your skin when it dries. Also, wet skin can become infected.  Cover the cast or splint with a plastic bag when bathing or when out in the rain or snow. If the cast is on the trunk of the body, take sponge baths until the cast is removed.  If your cast does become wet, dry it with a towel or  a blow dryer on the cool setting only.  Keep your cast or splint clean. Soiled casts may be wiped with a moistened cloth.  Do not place any hard or soft foreign objects under your cast or splint, such as cotton, toilet paper, lotion, or powder.  Do not try to scratch the skin under the cast with any object. The object could get stuck inside the cast. Also, scratching could lead to an infection. If itching is a problem, use a blow dryer on a cool setting to relieve discomfort.  Do not trim or cut  your cast or remove padding from inside of it.  Exercise all joints next to the injury that are not immobilized by the cast or splint. For example, if you have a long leg cast, exercise the hip joint and toes. If you have an arm cast or splint, exercise the shoulder, elbow, thumb, and fingers.  Elevate your injured arm or leg on 1 or 2 pillows for the first 1 to 3 days to decrease swelling and pain.It is best if you can comfortably elevate your cast so it is higher than your heart. SEEK MEDICAL CARE IF:   Your cast or splint cracks.  Your cast or splint is too tight or too loose.  You have unbearable itching inside the cast.  Your cast becomes wet or develops a soft spot or area.  You have a bad smell coming from inside your cast.  You get an object stuck under your cast.  Your skin around the cast becomes red or raw.  You have new pain or worsening pain after the cast has been applied. SEEK IMMEDIATE MEDICAL CARE IF:   You have fluid leaking through the cast.  You are unable to move your fingers or toes.  You have discolored (blue or white), cool, painful, or very swollen fingers or toes beyond the cast.  You have tingling or numbness around the injured area.  You have severe pain or pressure under the cast.  You have any difficulty with your breathing or have shortness of breath.  You have chest pain. Document Released: 02/07/2000 Document Revised: 11/30/2012 Document Reviewed: 08/18/2012 [facility] Patient Information 2014 [place].

## 2013-07-23 NOTE — ED Provider Notes (Signed)
CSN: [id number]     Arrival date & time 07/23/13  2128 History  This chart was scribed for Antonietta Breach, PA-C, non-physician practitioner working with Mirna Mires, MD by Vernell Barrier, ED scribe. This patient was seen in room WTR8/WTR8 and the patient's care was started at 11:09 PM.  Chief Complaint  Patient presents with  . Ankle Injury   The history is provided by the patient. No language interpreter was used.   HPI Comments: Bridget [name] is a 63 y.o. female w/ hx of osteoporosis and arthritis presents to the Emergency Department complaining of left ankle pain w/ associated swelling following a fall; onset approximately 2 hours ago. Pt tripped on the stairs and as a result fell down 2 stairs. States she heard her left ankle pop on impact. Has been applying ice to the site for swelling but has not taken any medication for pain. Has been seen by Mardelle Matte and Alfonso Ramus in the [facility] orthopedics group recently for a back injury. She denies numbness/tingling, weakness, pallor, head injury, and LOC.   Past Medical History  Diagnosis Date  . Hypothyroid   . Hypertension   . Kidney stones   . Heart murmur   . Osteoporosis     & osteopenia   Past Surgical History  Procedure Laterality Date  . Appendectomy    . Breast biopsy Left     benign  . Breast reduction surgery    . Small bowel injury      fistula  . Ganglion cyst excision  4/12    left foot & left arm   Family History  Problem Relation Age of Onset  . Breast cancer Mother   . Cancer Brother     leukemia  . Emphysema Father   . Heart attack Father    History  Substance Use Topics  . Smoking status: Never Smoker   . Smokeless tobacco: Not on file  . Alcohol Use: No   OB History   Grav Para Term Preterm Abortions TAB SAB Ect Mult Living   1 1 1       1       Review of Systems  Musculoskeletal: Positive for arthralgias, joint swelling and myalgias.  All other systems reviewed and are negative.   Allergies   Codeine; Lisinopril; and Vicodin  Home Medications   Prior to Admission medications   Medication Sig Start Date End Date Taking? Authorizing Provider  acetaminophen (TYLENOL) 500 MG tablet Take 1,000 mg by mouth every 6 (six) hours as needed. For pain   Yes Historical Provider, MD  acetaminophen-codeine (TYLENOL #3) 300-30 MG per tablet Take 1 tablet by mouth every 4 (four) hours as needed for moderate pain.   Yes Historical Provider, MD  aspirin 81 MG chewable tablet Chew 81 mg by mouth daily.   Yes Historical Provider, MD  cetirizine (ZYRTEC) 10 MG tablet Take 10 mg by mouth daily as needed for allergies.    Yes Historical Provider, MD  diazepam (VALIUM) 2 MG tablet Take 12 mg by mouth every 6 (six) hours as needed for muscle spasms.  07/10/13  Yes Historical Provider, MD  Docusate Sodium (COLACE PO) Take 100 mg by mouth daily as needed. constipation   Yes Historical Provider, MD  hydrochlorothiazide (MICROZIDE) 12.5 MG capsule Take 12.5 mg by mouth daily.   Yes Historical Provider, MD  ibuprofen (ADVIL,MOTRIN) 200 MG tablet Take 400 mg by mouth every 6 (six) hours as needed for moderate pain.   Yes Historical Provider,  MD  levothyroxine (SYNTHROID, LEVOTHROID) 100 MCG tablet Take 100 mcg by mouth daily before breakfast.   Yes Historical Provider, MD  LORazepam (ATIVAN) 1 MG tablet Take 0.5 mg by mouth every 8 (eight) hours as needed. For anxiety   Yes Historical Provider, MD  MELATONIN PO Take 5 mg by mouth at bedtime.    Yes Historical Provider, MD  Multiple Vitamin (MULTIVITAMIN WITH MINERALS) TABS tablet Take 1 tablet by mouth daily.   Yes Historical Provider, MD   Triage vitals: BP 192/77  Pulse 97  Temp(Src) 98.1 F (36.7 C) (Oral)  Resp 20  SpO2 97%  LMP 02/23/1993  Physical Exam  Nursing note and vitals reviewed. Constitutional: She is oriented to person, place, and time. She appears well-developed and well-nourished. No distress.  HENT:  Head: Normocephalic and atraumatic.   Eyes: Conjunctivae and EOM are normal. No scleral icterus.  Neck: Normal range of motion.  Cardiovascular: Normal rate, regular rhythm and intact distal pulses.   Pulses:      Dorsalis pedis pulses are 2+ on the left side.       Posterior tibial pulses are 2+ on the left side.  Capillary refill normal in all digits of L foot.  Pulmonary/Chest: Effort normal. No respiratory distress.  Musculoskeletal: She exhibits tenderness.       Left ankle: She exhibits decreased range of motion (secondary to pain), swelling and ecchymosis (mild lateral malleolus). She exhibits no deformity. Tenderness. Lateral malleolus tenderness found. Achilles tendon normal.       Left lower leg: Normal.       Left foot: She exhibits tenderness, bony tenderness and swelling (mild). She exhibits normal range of motion, normal capillary refill, no crepitus and no deformity.       Feet:  Neurological: She is alert and oriented to person, place, and time.  No numbness, tingling or weakness of the affected extremity. Patient able to wiggle all toes. Sensation to light touch intact.  Skin: Skin is warm and dry. No rash noted. She is not diaphoretic. No erythema. No pallor.  Psychiatric: She has a normal mood and affect. Her behavior is normal.    ED Course  Procedures (including critical care time) DIAGNOSTIC STUDIES: Oxygen Saturation is 97% on room air, normal by my interpretation.    COORDINATION OF CARE: At 11:17 PM: Discussed treatment plan with patient which includes consultation with orthopedics for further evaluation. Patient agrees.   Labs Review Labs Reviewed - No data to display  Imaging Review Dg Ankle Complete Left  07/23/2013   CLINICAL DATA:  Left foot injury.  Pain.  EXAM: LEFT ANKLE COMPLETE - 3+ VIEW  COMPARISON:  None.  FINDINGS: There is a fracture of the distal fibula, across the distal tip, the low the ankle mortise. The fracture is nondisplaced.  No other evidence of a fracture. The ankle  mortise is normally space and aligned.  There is soft tissue swelling predominates anteriorly and laterally.  IMPRESSION: Nondisplaced fracture across the inferior tip of the distal fibula. No other fracture. No dislocation.   Electronically Signed   By: Lajean Manes M.D.   On: 07/23/2013 22:58   Dg Foot Complete Left  07/23/2013   CLINICAL DATA:  Ankle injury.  EXAM: LEFT FOOT - COMPLETE 3+ VIEW  COMPARISON:  None.  FINDINGS: There is an avulsion fracture through the base of the fifth metatarsal with 2 mm of distraction and no displacement. Lateral malleolus avulsion fracture. Focal soft tissue swelling at the level  of the dorsal midfoot. There is underlying bone spurring but no fracture at this location. Normal foot alignment.  IMPRESSION: 1. Fifth metatarsal base avulsion fracture. 2. Lateral malleolus avulsion fracture.   Electronically Signed   By: Jorje Guild M.D.   On: 07/23/2013 23:00     EKG Interpretation None      MDM   Final diagnoses:  Fracture of fifth metatarsal bone of left foot  Closed left fibular fracture  Ankle pain, left    Uncomplicated fracture of distal left fibula as well as base of fifth metatarsal of left foot. Patient neurovascularly intact. Normal sensation to light touch. Consulted with Dr. Mardelle Matte [facility] who recommends CAM walker and non weightbearing or touch toe weight bearing if necessary. Crutches provided and RICE advised. Patient offered Norco, Percocet, and Tramadol for pain control which she declines; states she would prefer to stick with ibuprofen. Return precautions provided and patient agreeable to plan with no unaddressed concerns.  I personally performed the services described in this documentation, which was scribed in my presence. The recorded information has been reviewed and is accurate.  Filed Vitals:   07/23/13 2204 07/23/13 2352  BP: 192/77 153/66  Pulse: 97   Temp: 98.1 F (36.7 C) 98.1 F (36.7 C)  TempSrc: Oral Oral   Resp: 20 20  SpO2: 97%       Antonietta Breach, PA-C 07/24/13 0001

## 2013-07-23 NOTE — ED Notes (Signed)
Pt stepped wrong on the steps and fell down 2 steps. Pt states she felt and heard her L ankle pop. Pt has swelling to medial side of L ankle. Pt states she is unable to bear wt.

## 2013-07-24 NOTE — ED Provider Notes (Signed)
Medical screening examination/treatment/procedure(s) were performed by non-physician practitioner and as supervising physician I was immediately available for consultation/collaboration.     Mirna Mires, MD 07/24/13 1346

## 2013-09-28 ENCOUNTER — Ambulatory Visit (INDEPENDENT_AMBULATORY_CARE_PROVIDER_SITE_OTHER): Payer: Federal, State, Local not specified - PPO | Admitting: Certified Nurse Midwife

## 2013-09-28 ENCOUNTER — Encounter: Payer: Self-pay | Admitting: Certified Nurse Midwife

## 2013-09-28 VITALS — BP 120/84 | HR 72 | Ht 60.5 in | Wt 159.0 lb

## 2013-09-28 DIAGNOSIS — Z124 Encounter for screening for malignant neoplasm of cervix: Secondary | ICD-10-CM

## 2013-09-28 DIAGNOSIS — I10 Essential (primary) hypertension: Secondary | ICD-10-CM | POA: Insufficient documentation

## 2013-09-28 DIAGNOSIS — Z01419 Encounter for gynecological examination (general) (routine) without abnormal findings: Secondary | ICD-10-CM

## 2013-09-28 NOTE — Patient Instructions (Signed)

## 2013-09-28 NOTE — Progress Notes (Signed)
Patient ID: Bridget [name], female   DOB: [birth year], 63 y.o.   MRN: [id number] 63 y.o. G53P1001 Married Caucasian Fe here for annual exam. Menopausal no HRT. Denies vaginal bleeding or vaginal dryness. Fractured left fibula with fall, no surgery required, under follow up with orthopedic. Sees Endocrine for Hypothyroid and Lipid management,sees PCP for hypertension management. All labs with MDs. No health issues today.  Patient's last menstrual period was 02/23/1993.          Sexually active: yes  The current method of family planning is none.  Exercising: yes walking  Smoker: no   Health Maintenance:  Pap: 09-25-11 neg HPV HR neg  MMG: 10/20/12, Bi-Rads 1: negative  Colonoscopy: 2012 negative 5 years.  BMD: 2012  TDaP: 2007 Labs: PCP and Dr. Chalmers Cater  Self breast exam: done occ   reports that she has never smoked. She does not have any smokeless tobacco history on file. She reports that she does not drink alcohol or use illicit drugs.  Past Medical History  Diagnosis Date  . Hypothyroid   . Hypertension   . Kidney stones   . Heart murmur   . Osteoporosis     & osteopenia    Past Surgical History  Procedure Laterality Date  . Appendectomy    . Breast biopsy Left     benign  . Breast reduction surgery    . Small bowel injury      fistula  . Ganglion cyst excision  4/12    left foot & left arm    Current Outpatient Prescriptions  Medication Sig Dispense Refill  . acetaminophen (TYLENOL) 500 MG tablet Take 1,000 mg by mouth every 6 (six) hours as needed. For pain      . acetaminophen-codeine (TYLENOL #3) 300-30 MG per tablet Take 1 tablet by mouth every 4 (four) hours as needed for moderate pain.      Marland Kitchen aspirin 81 MG chewable tablet Chew 81 mg by mouth daily.      . cetirizine (ZYRTEC) 10 MG tablet Take 10 mg by mouth daily as needed for allergies.       . diazepam (VALIUM) 2 MG tablet Take 12 mg by mouth every 6 (six) hours as needed for muscle spasms.       Mariane Baumgarten Sodium  (COLACE PO) Take 100 mg by mouth daily as needed. constipation      . hydrochlorothiazide (MICROZIDE) 12.5 MG capsule Take 12.5 mg by mouth daily.      Marland Kitchen ibuprofen (ADVIL,MOTRIN) 200 MG tablet Take 400 mg by mouth every 6 (six) hours as needed for moderate pain.      Marland Kitchen levothyroxine (SYNTHROID, LEVOTHROID) 100 MCG tablet Take 100 mcg by mouth daily before breakfast.      . LORazepam (ATIVAN) 1 MG tablet Take 0.5 mg by mouth every 8 (eight) hours as needed. For anxiety      . MELATONIN PO Take 5 mg by mouth at bedtime.       . Multiple Vitamin (MULTIVITAMIN WITH MINERALS) TABS tablet Take 1 tablet by mouth daily.       No current facility-administered medications for this visit.    Family History  Problem Relation Age of Onset  . Breast cancer Mother   . Cancer Brother     leukemia  . Emphysema Father   . Heart attack Father     ROS:  Pertinent items are noted in HPI.  Otherwise, a comprehensive ROS was negative.  Exam:   LMP  02/23/1993    Ht Readings from Last 3 Encounters:  02/28/13 5' (1.524 m)  09/27/12 5' 0.5" (1.537 m)  11/12/08 5' (1.524 m)    General appearance: alert, cooperative and appears stated age Head: Normocephalic, without obvious abnormality, atraumatic Neck: no adenopathy, supple, symmetrical, trachea midline and thyroid normal to inspection and palpation and non-palpable Lungs: clear to auscultation bilaterally Breasts: normal appearance, no masses or tenderness, No nipple retraction or dimpling, No nipple discharge or bleeding, No axillary or supraclavicular adenopathy Heart: regular rate and rhythm Abdomen: soft, non-tender; no masses,  no organomegaly Extremities: extremities normal, atraumatic, no cyanosis or edema Skin: Skin color, texture, turgor normal. No rashes or lesions Lymph nodes: Cervical, supraclavicular, and axillary nodes normal. No abnormal inguinal nodes palpated Neurologic: Grossly normal   Pelvic: External genitalia:  no lesions               Urethra:  normal appearing urethra with no masses, tenderness or lesions              Bartholin's and Skene's: normal                 Vagina: normal appearing vagina with normal color and discharge, no lesions              Cervix: normal, non tender, no lesions              Pap taken: Yes.   Bimanual Exam:  Uterus:  normal size, contour, position, consistency, mobility, non-tender and anteverted              Adnexa: normal adnexa and no mass, fullness, tenderness               Rectovaginal: Confirms               Anus:  normal sphincter tone, no lesions  A:  Well Woman with normal exam  Menopausal no HRT  Hypertension/Hypothyroid stable medication with Endocrine management  Left fibula fracture under follow up with orthopedic  P:   Reviewed health and wellness pertinent to exam  Aware of need to evaluate if vaginal bleeding  Continue follow up as indicated  Discussed BMD due, patient will schedule. Vitamin D assessed with PCP normal. Patient consumes 4 servings of calcium daily. Discussed fall prevention  Pap smear taken today with HPV reflex   counseled on breast self exam, mammography screening, feminine hygiene, adequate intake of calcium and vitamin D, diet and exercise  return annually or prn  An After Visit Summary was printed and given to the patient.

## 2013-09-28 NOTE — Progress Notes (Signed)
Reviewed personally.  M. Suzanne Dorn Hartshorne, MD.  

## 2013-10-02 LAB — IPS PAP TEST WITH REFLEX TO HPV

## 2013-10-04 ENCOUNTER — Ambulatory Visit: Payer: Federal, State, Local not specified - PPO | Admitting: Certified Nurse Midwife

## 2013-10-04 ENCOUNTER — Other Ambulatory Visit: Payer: Self-pay

## 2013-10-04 DIAGNOSIS — Z9889 Other specified postprocedural states: Secondary | ICD-10-CM

## 2013-10-04 DIAGNOSIS — Z1231 Encounter for screening mammogram for malignant neoplasm of breast: Secondary | ICD-10-CM

## 2013-11-06 ENCOUNTER — Ambulatory Visit
Admission: RE | Admit: 2013-11-06 | Discharge: 2013-11-06 | Disposition: A | Discharge status: Home / Self Care | Payer: Federal, State, Local not specified - PPO | Source: Ambulatory Visit

## 2013-11-06 DIAGNOSIS — Z1231 Encounter for screening mammogram for malignant neoplasm of breast: Secondary | ICD-10-CM

## 2013-11-06 DIAGNOSIS — Z9889 Other specified postprocedural states: Secondary | ICD-10-CM

## 2013-12-19 DIAGNOSIS — H43393 Other vitreous opacities, bilateral: Secondary | ICD-10-CM | POA: Diagnosis not present

## 2013-12-19 DIAGNOSIS — H524 Presbyopia: Secondary | ICD-10-CM | POA: Diagnosis not present

## 2013-12-19 DIAGNOSIS — H2513 Age-related nuclear cataract, bilateral: Secondary | ICD-10-CM | POA: Diagnosis not present

## 2013-12-25 ENCOUNTER — Encounter: Payer: Self-pay | Admitting: Certified Nurse Midwife

## 2014-01-25 DIAGNOSIS — J069 Acute upper respiratory infection, unspecified: Secondary | ICD-10-CM | POA: Diagnosis not present

## 2014-03-02 ENCOUNTER — Ambulatory Visit (INDEPENDENT_AMBULATORY_CARE_PROVIDER_SITE_OTHER): Payer: Medicare Other | Admitting: Cardiology

## 2014-03-02 ENCOUNTER — Encounter: Payer: Self-pay | Admitting: Cardiology

## 2014-03-02 VITALS — BP 132/78 | HR 58 | Ht 60.5 in | Wt 160.0 lb

## 2014-03-02 DIAGNOSIS — I10 Essential (primary) hypertension: Secondary | ICD-10-CM | POA: Diagnosis not present

## 2014-03-02 DIAGNOSIS — L659 Nonscarring hair loss, unspecified: Secondary | ICD-10-CM

## 2014-03-02 NOTE — Patient Instructions (Signed)
Please stop your HCTZ. Continue all other medications as listed.  Follow up with Dr Marlou Porch as needed.

## 2014-03-02 NOTE — Progress Notes (Signed)
[facility]. [address], [address], [place]  [address] Phone: [phone] Fax:  [phone]  Date:  03/02/2014   ID:  Bridget [name], DOB [birth year], MRN [id number]  PCP:  Osborne Casco, MD   History of Present Illness: Bridget [name] is a 64 y.o. female in the emergency department for chest discomfort on 08/17/12 here for followup. Started at 1pm at home. Was 4 pm when finally had ECG. Felt like a pressure, not burning. Radiating tightness in jaw (can feel this also with slight incline). Relieved with rest in general. This pain went away with time. Concerned. Stress. ?GERD. Her lab work was unremarkable, normal troponin, normal hemoglobin of 13.3, normal creatinine of 0.83. Her glucose was mildly elevated at 128. Her EKG showed sinus bradycardia rate 59 with nonspecific ST changes. No obvious ST segment depression noted. Personally reviewed. Chest x-ray also showed no active disease. She states that she was there for approximately 4 hours and decided to leave. She was unhappy with her wait time. Today she is feeling better. The day following her chest discomfort she did feel some mild soreness to her chest wall. She had a cardiac catheterization in 2012 which was normal.  02/28/13 - still noticing some jaw tightness and shoulders with hill. Anxious with it.  03/02/14 - Going bald, none of her family members grandmother had hair problems. Tried cardizem but not much difference. Frustrated. Using rogaine. Still feels shoulders pressure during walking at times. Mild. She wants to come off of HCTZ. Her husband sees Dr. Mare Ferrari.   Wt Readings from Last 3 Encounters:  03/02/14 160 lb (72.576 kg)  09/28/13 159 lb (72.122 kg)  02/28/13 160 lb (72.576 kg)     Past Medical History  Diagnosis Date  . Hypothyroid   . Hypertension   . Kidney stones   . Heart murmur   . Osteoporosis     & osteopenia    Past Surgical History  Procedure Laterality Date  . Appendectomy    . Breast  biopsy Left     benign  . Breast reduction surgery    . Small bowel injury      fistula  . Ganglion cyst excision  4/12    left foot & left arm    Current Outpatient Prescriptions  Medication Sig Dispense Refill  . acetaminophen (TYLENOL) 500 MG tablet Take 1,000 mg by mouth every 6 (six) hours as needed. For pain    . aspirin 81 MG chewable tablet Chew 81 mg by mouth daily.    . cetirizine (ZYRTEC) 10 MG tablet Take 10 mg by mouth daily as needed for allergies.     Mariane Baumgarten Sodium (COLACE PO) Take 100 mg by mouth daily as needed. constipation    . hydrochlorothiazide (MICROZIDE) 12.5 MG capsule Take 12.5 mg by mouth daily.    Marland Kitchen ibuprofen (ADVIL,MOTRIN) 200 MG tablet Take 400 mg by mouth every 6 (six) hours as needed for moderate pain.    Marland Kitchen levothyroxine (SYNTHROID, LEVOTHROID) 100 MCG tablet Take 100 mcg by mouth daily before breakfast.    . LORazepam (ATIVAN) 1 MG tablet Take 0.5 mg by mouth every 8 (eight) hours as needed. For anxiety    . MELATONIN PO Take 5 mg by mouth at bedtime.     . Multiple Vitamin (MULTIVITAMIN WITH MINERALS) TABS tablet Take 1 tablet by mouth daily.     No current facility-administered medications for this visit.    Allergies:  Allergies  Allergen Reactions  . Codeine Nausea Only  . Lisinopril Other (See Comments)    cough  . Vicodin [Hydrocodone-Acetaminophen] Other (See Comments)    Made her feel off    Social History:  The patient  reports that she has never smoked. She does not have any smokeless tobacco history on file. She reports that she does not drink alcohol or use illicit drugs.   ROS:  Please see the history of present illness.     PHYSICAL EXAM: VS:  BP 132/78 mmHg  Pulse 58  Ht 5' 0.5" (1.537 m)  Wt 160 lb (72.576 kg)  BMI 30.72 kg/m2  LMP 02/23/1993 Well nourished, well developed, in no acute distress HEENT: normal Neck: no JVD Cardiac:  normal S1, S2; RRR; no murmur Lungs:  clear to auscultation bilaterally, no  wheezing, rhonchi or rales Abd: soft, nontender, no hepatomegaly Ext: no edema Skin: warm and dry Neuro: no focal abnormalities noted  EKG:  03/02/14-sinus bradycardia rate 58 with nonspecific ST T wave changes.d, previously unremarkable.  ASSESSMENT AND PLAN:  1. Hair loss-Rogaine, recommended by dermatology. She is concerned that HCTZ may be contributing. I'm fine with her discontinuing this as long she watches her blood pressure. She is a retired Marine scientist, understands parameters. She is off of Cardizem. Did not make much of a difference. She still feeling some mild shoulder pressure when walking. Continue to monitor. Prior cardiac catheterization in 2012 was reassuring. She asked me if there was a good alternative to Ativan, I asked her to discuss this further with Dr. Laurann Montana. Weight loss will be helpful. 2. When necessary follow-up.  Signed, Candee Furbish, MD [facility]  03/02/2014 2:06 PM

## 2014-04-03 DIAGNOSIS — M25559 Pain in unspecified hip: Secondary | ICD-10-CM | POA: Diagnosis not present

## 2014-04-03 DIAGNOSIS — I10 Essential (primary) hypertension: Secondary | ICD-10-CM | POA: Diagnosis not present

## 2014-04-03 DIAGNOSIS — M542 Cervicalgia: Secondary | ICD-10-CM | POA: Diagnosis not present

## 2014-04-03 DIAGNOSIS — M25529 Pain in unspecified elbow: Secondary | ICD-10-CM | POA: Diagnosis not present

## 2014-04-10 DIAGNOSIS — M7702 Medial epicondylitis, left elbow: Secondary | ICD-10-CM | POA: Diagnosis not present

## 2014-04-16 DIAGNOSIS — S134XXD Sprain of ligaments of cervical spine, subsequent encounter: Secondary | ICD-10-CM | POA: Diagnosis not present

## 2014-04-27 DIAGNOSIS — L0100 Impetigo, unspecified: Secondary | ICD-10-CM | POA: Diagnosis not present

## 2014-04-27 DIAGNOSIS — E213 Hyperparathyroidism, unspecified: Secondary | ICD-10-CM | POA: Diagnosis not present

## 2014-04-27 DIAGNOSIS — Z23 Encounter for immunization: Secondary | ICD-10-CM | POA: Diagnosis not present

## 2014-04-27 DIAGNOSIS — J069 Acute upper respiratory infection, unspecified: Secondary | ICD-10-CM | POA: Diagnosis not present

## 2014-04-27 DIAGNOSIS — I10 Essential (primary) hypertension: Secondary | ICD-10-CM | POA: Diagnosis not present

## 2014-04-27 DIAGNOSIS — J309 Allergic rhinitis, unspecified: Secondary | ICD-10-CM | POA: Diagnosis not present

## 2014-04-27 DIAGNOSIS — E039 Hypothyroidism, unspecified: Secondary | ICD-10-CM | POA: Diagnosis not present

## 2014-04-27 DIAGNOSIS — Z0000 Encounter for general adult medical examination without abnormal findings: Secondary | ICD-10-CM | POA: Diagnosis not present

## 2014-04-27 DIAGNOSIS — F39 Unspecified mood [affective] disorder: Secondary | ICD-10-CM | POA: Diagnosis not present

## 2014-05-10 DIAGNOSIS — E039 Hypothyroidism, unspecified: Secondary | ICD-10-CM | POA: Diagnosis not present

## 2014-05-10 DIAGNOSIS — Z87442 Personal history of urinary calculi: Secondary | ICD-10-CM | POA: Diagnosis not present

## 2014-05-10 DIAGNOSIS — L659 Nonscarring hair loss, unspecified: Secondary | ICD-10-CM | POA: Diagnosis not present

## 2014-05-10 DIAGNOSIS — R5383 Other fatigue: Secondary | ICD-10-CM | POA: Diagnosis not present

## 2014-05-10 DIAGNOSIS — E213 Hyperparathyroidism, unspecified: Secondary | ICD-10-CM | POA: Diagnosis not present

## 2014-05-22 DIAGNOSIS — E039 Hypothyroidism, unspecified: Secondary | ICD-10-CM | POA: Diagnosis not present

## 2014-06-03 DIAGNOSIS — J45998 Other asthma: Secondary | ICD-10-CM | POA: Diagnosis not present

## 2014-06-03 DIAGNOSIS — J069 Acute upper respiratory infection, unspecified: Secondary | ICD-10-CM | POA: Diagnosis not present

## 2014-06-03 DIAGNOSIS — J3089 Other allergic rhinitis: Secondary | ICD-10-CM | POA: Diagnosis not present

## 2014-06-06 DIAGNOSIS — B349 Viral infection, unspecified: Secondary | ICD-10-CM | POA: Diagnosis not present

## 2014-06-06 DIAGNOSIS — J111 Influenza due to unidentified influenza virus with other respiratory manifestations: Secondary | ICD-10-CM | POA: Diagnosis not present

## 2014-06-21 DIAGNOSIS — E039 Hypothyroidism, unspecified: Secondary | ICD-10-CM | POA: Diagnosis not present

## 2014-07-04 DIAGNOSIS — L659 Nonscarring hair loss, unspecified: Secondary | ICD-10-CM | POA: Diagnosis not present

## 2014-07-04 DIAGNOSIS — D2261 Melanocytic nevi of right upper limb, including shoulder: Secondary | ICD-10-CM | POA: Diagnosis not present

## 2014-07-04 DIAGNOSIS — D2272 Melanocytic nevi of left lower limb, including hip: Secondary | ICD-10-CM | POA: Diagnosis not present

## 2014-07-04 DIAGNOSIS — D2271 Melanocytic nevi of right lower limb, including hip: Secondary | ICD-10-CM | POA: Diagnosis not present

## 2014-07-04 DIAGNOSIS — L918 Other hypertrophic disorders of the skin: Secondary | ICD-10-CM | POA: Diagnosis not present

## 2014-07-04 DIAGNOSIS — D225 Melanocytic nevi of trunk: Secondary | ICD-10-CM | POA: Diagnosis not present

## 2014-07-04 DIAGNOSIS — D1801 Hemangioma of skin and subcutaneous tissue: Secondary | ICD-10-CM | POA: Diagnosis not present

## 2014-08-14 ENCOUNTER — Other Ambulatory Visit: Payer: Self-pay | Admitting: Internal Medicine

## 2014-08-14 DIAGNOSIS — E063 Autoimmune thyroiditis: Secondary | ICD-10-CM | POA: Diagnosis not present

## 2014-08-14 DIAGNOSIS — Z87442 Personal history of urinary calculi: Secondary | ICD-10-CM | POA: Diagnosis not present

## 2014-08-14 DIAGNOSIS — E039 Hypothyroidism, unspecified: Secondary | ICD-10-CM | POA: Diagnosis not present

## 2014-08-14 DIAGNOSIS — E213 Hyperparathyroidism, unspecified: Secondary | ICD-10-CM | POA: Diagnosis not present

## 2014-08-15 ENCOUNTER — Ambulatory Visit
Admission: RE | Admit: 2014-08-15 | Discharge: 2014-08-15 | Disposition: A | Discharge status: Home / Self Care | Payer: Medicare Other | Source: Ambulatory Visit | Attending: Internal Medicine | Admitting: Internal Medicine

## 2014-08-15 DIAGNOSIS — E063 Autoimmune thyroiditis: Secondary | ICD-10-CM

## 2014-08-15 DIAGNOSIS — M542 Cervicalgia: Secondary | ICD-10-CM | POA: Diagnosis not present

## 2014-08-20 ENCOUNTER — Other Ambulatory Visit: Payer: Self-pay

## 2014-10-01 ENCOUNTER — Ambulatory Visit: Payer: Federal, State, Local not specified - PPO | Admitting: Certified Nurse Midwife

## 2014-10-12 ENCOUNTER — Other Ambulatory Visit: Payer: Self-pay

## 2014-10-12 ENCOUNTER — Ambulatory Visit: Payer: Federal, State, Local not specified - PPO | Admitting: Certified Nurse Midwife

## 2014-10-12 ENCOUNTER — Encounter: Payer: Self-pay | Admitting: Certified Nurse Midwife

## 2014-10-12 ENCOUNTER — Ambulatory Visit (INDEPENDENT_AMBULATORY_CARE_PROVIDER_SITE_OTHER): Payer: Medicare Other | Admitting: Certified Nurse Midwife

## 2014-10-12 VITALS — BP 120/70 | HR 70 | Resp 16 | Ht 60.25 in | Wt 161.0 lb

## 2014-10-12 DIAGNOSIS — Z124 Encounter for screening for malignant neoplasm of cervix: Secondary | ICD-10-CM | POA: Diagnosis not present

## 2014-10-12 DIAGNOSIS — Z01419 Encounter for gynecological examination (general) (routine) without abnormal findings: Secondary | ICD-10-CM

## 2014-10-12 DIAGNOSIS — Z1231 Encounter for screening mammogram for malignant neoplasm of breast: Secondary | ICD-10-CM

## 2014-10-12 NOTE — Progress Notes (Signed)
Reviewed personally.  M. Suzanne Lynnix Schoneman, MD.  

## 2014-10-12 NOTE — Progress Notes (Signed)
64 y.o. G29P1001 Married  Caucasian Fe here for annual exam. Denies vaginal bleeding. Vaginal dryness treated with Olive Oil, working well. Sees PCP for hypertension/labs/aex. Sees Endocrine for hypothyroid management, all stable. Saw dermatology for hair loss with no concerns or change. Felt is was related to Hypothyroid. 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 No other health issues today.   Patient's last menstrual period was 02/23/1993.          Sexually active: No.  The current method of family planning is vasectomy.    Exercising: Yes.    walking dog Smoker:  no  Health Maintenance: Pap: 09-28-13 neg MMG: 11-06-13 category c density,birads 1:neg Colonoscopy: 2012 neg f/u 66yrs BMD:   2012 TDaP:  2007 Labs: pcp Self breast exam: done occ   reports that she has never smoked. She does not have any smokeless tobacco history on file. She reports that she does not drink alcohol or use illicit drugs.  Past Medical History  Diagnosis Date  . Hypothyroid   . Hypertension   . Kidney stones   . Heart murmur   . Osteoporosis     & osteopenia    Past Surgical History  Procedure Laterality Date  . Appendectomy    . Breast biopsy Left     benign  . Breast reduction surgery    . Small bowel injury      fistula  . Ganglion cyst excision  4/12    left foot & left arm    Current Outpatient Prescriptions  Medication Sig Dispense Refill  . acetaminophen (TYLENOL) 500 MG tablet Take 1,000 mg by mouth every 6 (six) hours as needed. For pain    . aspirin 81 MG chewable tablet Chew 81 mg by mouth daily.    Marland Kitchen diltiazem (CARDIZEM CD) 180 MG 24 hr capsule Take 180 mg by mouth daily.  4  . Docusate Sodium (COLACE PO) Take 100 mg by mouth daily as needed. constipation    . hydrochlorothiazide (MICROZIDE) 12.5 MG capsule Take 12.5 mg by mouth. Every 2-3 days  11  . ibuprofen (ADVIL,MOTRIN) 200 MG tablet Take 400 mg by mouth every 6 (six) hours as needed  for moderate pain.    Marland Kitchen levothyroxine (SYNTHROID, LEVOTHROID) 100 MCG tablet Take 100 mcg by mouth daily before breakfast.    . LORazepam (ATIVAN) 1 MG tablet Take 0.5 mg by mouth every 8 (eight) hours as needed. For anxiety    . MELATONIN PO Take 5 mg by mouth as needed.     . Multiple Vitamin (MULTIVITAMIN WITH MINERALS) TABS tablet Take 1 tablet by mouth as needed.      No current facility-administered medications for this visit.    Family History  Problem Relation Age of Onset  . Breast cancer Mother   . Cancer Brother     leukemia  . Emphysema Father   . Heart attack Father     ROS:  Pertinent items are noted in HPI.  Otherwise, a comprehensive ROS was negative.  Exam:   BP 120/70 mmHg  Pulse 70  Resp 16  Ht 5' 0.25" (1.53 m)  Wt 161 lb (73.029 kg)  BMI 31.20 kg/m2  LMP 02/23/1993 Height: 5' 0.25" (153 cm) Ht Readings from Last 3 Encounters:  10/12/14 5' 0.25" (1.53 m)  03/02/14 5' 0.5" (1.537 m)  09/28/13 5' 0.5" (1.537 m)    General appearance: alert, cooperative and appears stated age Head: Normocephalic, without  obvious abnormality, atraumatic Neck: no adenopathy, supple, symmetrical, trachea midline and thyroid normal to inspection and palpation Lungs: clear to auscultation bilaterally Breasts: normal appearance, no masses or tenderness, No nipple retraction or dimpling, No nipple discharge or bleeding, No axillary or supraclavicular adenopathy Heart: regular rate and rhythm Abdomen: soft, non-tender; no masses,  no organomegaly Extremities: extremities normal, atraumatic, no cyanosis or edema Skin: Skin color, texture, turgor normal. No rashes or lesions Lymph nodes: Cervical, supraclavicular, and axillary nodes normal. No abnormal inguinal nodes palpated Neurologic: Grossly normal   Pelvic: External genitalia:  no lesions              Urethra:  normal appearing urethra with no masses, tenderness or lesions              Bartholin's and Skene's: normal                  Vagina: normal appearing vagina with normal color and discharge, no lesions              Cervix: normal,non tender,no lesions              Pap taken: No. Bimanual Exam:  Uterus:  normal size, contour, position, consistency, mobility, non-tender              Adnexa: normal adnexa and no mass, fullness, tenderness               Rectovaginal: Confirms               Anus:  normal sphincter tone, no lesions  Chaperone present: Yes  A:  Well Woman with normal exam  Menopausal no HRT  Hypertension/Hypothyroid with MD management  Mammogram due plans to schedule today  Family history of breast cancer mother  P:   Reviewed health and wellness pertinent to exam  Aware of need to advise if vaginal bleeding  Continue follow up as recommended  Pap smear as above not taken   counseled on breast self exam, mammography screening, menopause, adequate intake of calcium and vitamin D, diet and exercise, BMD ? Due will check with PCP, feels has been done in past year or 2.  return annually or prn  An After Visit Summary was printed and given to the patient.

## 2014-10-12 NOTE — Patient Instructions (Signed)

## 2014-10-31 DIAGNOSIS — I10 Essential (primary) hypertension: Secondary | ICD-10-CM | POA: Diagnosis not present

## 2014-10-31 DIAGNOSIS — E039 Hypothyroidism, unspecified: Secondary | ICD-10-CM | POA: Diagnosis not present

## 2014-11-14 ENCOUNTER — Ambulatory Visit
Admission: RE | Admit: 2014-11-14 | Discharge: 2014-11-14 | Disposition: A | Discharge status: Home / Self Care | Payer: Medicare Other | Source: Ambulatory Visit

## 2014-11-14 DIAGNOSIS — Z1231 Encounter for screening mammogram for malignant neoplasm of breast: Secondary | ICD-10-CM | POA: Diagnosis not present

## 2014-12-11 DIAGNOSIS — H25043 Posterior subcapsular polar age-related cataract, bilateral: Secondary | ICD-10-CM | POA: Diagnosis not present

## 2014-12-13 DIAGNOSIS — E213 Hyperparathyroidism, unspecified: Secondary | ICD-10-CM | POA: Diagnosis not present

## 2014-12-13 DIAGNOSIS — Z23 Encounter for immunization: Secondary | ICD-10-CM | POA: Diagnosis not present

## 2014-12-13 DIAGNOSIS — E039 Hypothyroidism, unspecified: Secondary | ICD-10-CM | POA: Diagnosis not present

## 2014-12-13 DIAGNOSIS — I10 Essential (primary) hypertension: Secondary | ICD-10-CM | POA: Diagnosis not present

## 2015-03-12 DIAGNOSIS — M7581 Other shoulder lesions, right shoulder: Secondary | ICD-10-CM | POA: Diagnosis not present

## 2015-03-12 DIAGNOSIS — M542 Cervicalgia: Secondary | ICD-10-CM | POA: Diagnosis not present

## 2015-05-09 DIAGNOSIS — Z0000 Encounter for general adult medical examination without abnormal findings: Secondary | ICD-10-CM | POA: Diagnosis not present

## 2015-05-09 DIAGNOSIS — I10 Essential (primary) hypertension: Secondary | ICD-10-CM | POA: Diagnosis not present

## 2015-05-09 DIAGNOSIS — F39 Unspecified mood [affective] disorder: Secondary | ICD-10-CM | POA: Diagnosis not present

## 2015-05-09 DIAGNOSIS — E039 Hypothyroidism, unspecified: Secondary | ICD-10-CM | POA: Diagnosis not present

## 2015-05-09 DIAGNOSIS — E213 Hyperparathyroidism, unspecified: Secondary | ICD-10-CM | POA: Diagnosis not present

## 2015-05-09 DIAGNOSIS — Z23 Encounter for immunization: Secondary | ICD-10-CM | POA: Diagnosis not present

## 2015-05-09 DIAGNOSIS — J309 Allergic rhinitis, unspecified: Secondary | ICD-10-CM | POA: Diagnosis not present

## 2015-05-09 DIAGNOSIS — E063 Autoimmune thyroiditis: Secondary | ICD-10-CM | POA: Diagnosis not present

## 2015-05-23 DIAGNOSIS — M7742 Metatarsalgia, left foot: Secondary | ICD-10-CM | POA: Diagnosis not present

## 2015-07-02 DIAGNOSIS — J111 Influenza due to unidentified influenza virus with other respiratory manifestations: Secondary | ICD-10-CM | POA: Diagnosis not present

## 2015-07-02 DIAGNOSIS — B349 Viral infection, unspecified: Secondary | ICD-10-CM | POA: Diagnosis not present

## 2015-08-28 DIAGNOSIS — D2261 Melanocytic nevi of right upper limb, including shoulder: Secondary | ICD-10-CM | POA: Diagnosis not present

## 2015-08-28 DIAGNOSIS — L821 Other seborrheic keratosis: Secondary | ICD-10-CM | POA: Diagnosis not present

## 2015-08-28 DIAGNOSIS — D2262 Melanocytic nevi of left upper limb, including shoulder: Secondary | ICD-10-CM | POA: Diagnosis not present

## 2015-08-28 DIAGNOSIS — D225 Melanocytic nevi of trunk: Secondary | ICD-10-CM | POA: Diagnosis not present

## 2015-08-28 DIAGNOSIS — L738 Other specified follicular disorders: Secondary | ICD-10-CM | POA: Diagnosis not present

## 2015-08-28 DIAGNOSIS — D2272 Melanocytic nevi of left lower limb, including hip: Secondary | ICD-10-CM | POA: Diagnosis not present

## 2015-08-28 DIAGNOSIS — L650 Telogen effluvium: Secondary | ICD-10-CM | POA: Diagnosis not present

## 2015-08-28 DIAGNOSIS — D485 Neoplasm of uncertain behavior of skin: Secondary | ICD-10-CM | POA: Diagnosis not present

## 2015-10-15 ENCOUNTER — Ambulatory Visit (INDEPENDENT_AMBULATORY_CARE_PROVIDER_SITE_OTHER): Payer: Medicare Other | Admitting: Certified Nurse Midwife

## 2015-10-15 ENCOUNTER — Encounter: Payer: Self-pay | Admitting: Certified Nurse Midwife

## 2015-10-15 VITALS — BP 122/72 | HR 72 | Resp 16 | Ht 60.0 in | Wt 160.0 lb

## 2015-10-15 DIAGNOSIS — Z01419 Encounter for gynecological examination (general) (routine) without abnormal findings: Secondary | ICD-10-CM | POA: Diagnosis not present

## 2015-10-15 DIAGNOSIS — Z0000 Encounter for general adult medical examination without abnormal findings: Secondary | ICD-10-CM

## 2015-10-15 DIAGNOSIS — Z124 Encounter for screening for malignant neoplasm of cervix: Secondary | ICD-10-CM | POA: Diagnosis not present

## 2015-10-15 LAB — POCT URINALYSIS DIPSTICK
BILIRUBIN UA: NEGATIVE
Blood, UA: NEGATIVE
Glucose, UA: NEGATIVE
Ketones, UA: NEGATIVE
Nitrite, UA: NEGATIVE
PH UA: 7
Protein, UA: NEGATIVE
Urobilinogen, UA: NEGATIVE

## 2015-10-15 NOTE — Progress Notes (Signed)
65 y.o. G29P1001 Married  Caucasian [name] here for annual exam. Menopausal no HRT. Still having problems with hair loss and was treated with Rogaine with Dermatology, with no change. Denies vaginal bleeding or vaginal dryness. Sees Dr. Laurann Montana next month for aex/labs, Hypertension and anxiety medication. Sees Endocrine regarding Hypothyroid medication. Working with weight loss and may consider seeing [employer]. No other health issues today.  Patient's last menstrual period was 02/23/1993.          Sexually active: No.  The current method of family planning is post menopausal status.    Exercising: Yes  Gym 3-4 x weekly  Smoker:  no  Health Maintenance: Pap:  09/28/13 Neg  MMG:  11/14/14 BIRADS1:Neg  Colonoscopy:  2012 f/u 5 years  BMD:   09/19/08 Osteopenia  TDaP:  2007. Will get PCP  Shingles: 2014  Pneumonia: 2016 Hep C and HIV: Done: Neg Labs: PCP  UA: Neg Self Breast Check: Yes, daily   reports that she has never smoked. She has never used smokeless tobacco. She reports that she does not drink alcohol or use drugs.  Past Medical History:  Diagnosis Date  . Heart murmur   . Hypertension   . Hypothyroid   . Kidney stones   . Osteoporosis    & osteopenia    Past Surgical History:  Procedure Laterality Date  . APPENDECTOMY    . BREAST BIOPSY Left    benign  . BREAST REDUCTION SURGERY    . CERVIX LESION DESTRUCTION N/A 1970   repeat pap smears all  normal  . GANGLION CYST EXCISION  4/12   left foot & left arm  . small bowel injury     fistula    Current Outpatient Prescriptions  Medication Sig Dispense Refill  . acetaminophen (TYLENOL) 500 MG tablet Take 1,000 mg by mouth every 6 (six) hours as needed. For pain    . aspirin 81 MG chewable tablet Chew 81 mg by mouth daily.    Marland Kitchen diltiazem (CARDIZEM CD) 180 MG 24 hr capsule Take 180 mg by mouth daily.  4  . Docusate Sodium (COLACE PO) Take 100 mg by mouth daily as needed. constipation    . hydrochlorothiazide (MICROZIDE) 12.5  MG capsule Take 12.5 mg by mouth. Every 2-3 days  11  . ibuprofen (ADVIL,MOTRIN) 200 MG tablet Take 400 mg by mouth every 6 (six) hours as needed for moderate pain.    Marland Kitchen levothyroxine (SYNTHROID, LEVOTHROID) 100 MCG tablet Take 100 mcg by mouth daily before breakfast.    . LORazepam (ATIVAN) 1 MG tablet Take 0.5 mg by mouth every 8 (eight) hours as needed. For anxiety    . MELATONIN PO Take 5 mg by mouth as needed.     . Multiple Vitamin (MULTIVITAMIN WITH MINERALS) TABS tablet Take 1 tablet by mouth as needed.      No current facility-administered medications for this visit.     Family History  Problem Relation Age of Onset  . Breast cancer Mother   . Cancer Brother     leukemia  . Emphysema Father   . Heart attack Father     ROS:  Pertinent items are noted in HPI.  Otherwise, a comprehensive ROS was negative.  Exam:   BP (!) 148/70 (BP Location: Right Arm, Patient Position: Sitting, Cuff Size: Normal)   Pulse (!) 58   Resp 16   Ht 5' (1.524 m)   Wt 160 lb (72.6 kg)   LMP 02/23/1993  BMI 31.25 kg/m  Height: 5' (152.4 cm) Ht Readings from Last 3 Encounters:  10/15/15 5' (1.524 m)  10/12/14 5' 0.25" (1.53 m)  03/02/14 5' 0.5" (1.537 m)    General appearance: alert, cooperative and appears stated age Head: Normocephalic, without obvious abnormality, atraumatic Neck: no adenopathy, supple, symmetrical, trachea midline and thyroid normal to inspection and palpation Lungs: clear to auscultation bilaterally Breasts: normal appearance, no masses or tenderness, No nipple retraction or dimpling, No nipple discharge or bleeding, No axillary or supraclavicular adenopathy Heart: regular rate and rhythm Abdomen: soft, non-tender; no masses,  no organomegaly Extremities: extremities normal, atraumatic, no cyanosis or edema Skin: Skin color, texture, turgor normal. No rashes or lesions Lymph nodes: Cervical, supraclavicular, and axillary nodes normal. No abnormal inguinal nodes  palpated Neurologic: Grossly normal   Pelvic: External genitalia:  no lesions              Urethra:  normal appearing urethra with no masses, tenderness or lesions              Bartholin's and Skene's: normal                 Vagina: normal appearing vagina with normal color and discharge, no lesions              Cervix: no cervical motion tenderness, no lesions and normal appearacne              Pap taken: No. Bimanual Exam:  Uterus:  normal size, contour, position, consistency, mobility, non-tender              Adnexa: normal adnexa and no mass, fullness, tenderness               Rectovaginal: Confirms               Anus:  normal sphincter tone, no lesions  Chaperone present: yes  A:  Well Woman with normal exam  Menopausal no HRT  Hypothyroid/hypertension/anxiety with MD management  BMD due will schedule  Hair loss with dermatology management  P:   Reviewed health and wellness pertinent to exam  Aware of need to evaluate if vaginal bleeding  Continue follow up with MD as indicated  Pap smear as above not taken   counseled on breast self exam, mammography screening, adequate intake of calcium and vitamin D, diet and exercise, Kegel's exercises  An After Visit Summary was printed and given to the patient.

## 2015-10-15 NOTE — Patient Instructions (Signed)

## 2015-10-17 ENCOUNTER — Other Ambulatory Visit: Payer: Self-pay | Admitting: Family Medicine

## 2015-10-17 ENCOUNTER — Telehealth: Payer: Self-pay | Admitting: Certified Nurse Midwife

## 2015-10-17 DIAGNOSIS — M810 Age-related osteoporosis without current pathological fracture: Secondary | ICD-10-CM

## 2015-10-17 DIAGNOSIS — M8580 Other specified disorders of bone density and structure, unspecified site: Secondary | ICD-10-CM

## 2015-10-17 DIAGNOSIS — Z1231 Encounter for screening mammogram for malignant neoplasm of breast: Secondary | ICD-10-CM

## 2015-10-17 NOTE — Telephone Encounter (Signed)
Spoke with patient. Advised [facility] for [facility] for [facility]. She is agreeable and verbalizes understanding.  Routing to provider for final review. Patient agreeable to disposition. Will close encounter.

## 2015-10-17 NOTE — Telephone Encounter (Signed)
Patient called and states she is trying to set up an appointment at the [facility] for a bone density test, but was advised an order would need to be faxed in order for them to schedule.  Routing to Triage

## 2015-10-19 NOTE — Progress Notes (Signed)
Encounter reviewed Zariya Minner, MD   

## 2015-11-11 ENCOUNTER — Other Ambulatory Visit: Payer: Self-pay | Admitting: Family Medicine

## 2015-11-11 ENCOUNTER — Ambulatory Visit
Admission: RE | Admit: 2015-11-11 | Discharge: 2015-11-11 | Disposition: A | Discharge status: Home / Self Care | Payer: Medicare Other | Source: Ambulatory Visit | Attending: Family Medicine | Admitting: Family Medicine

## 2015-11-11 DIAGNOSIS — I10 Essential (primary) hypertension: Secondary | ICD-10-CM | POA: Diagnosis not present

## 2015-11-11 DIAGNOSIS — F39 Unspecified mood [affective] disorder: Secondary | ICD-10-CM | POA: Diagnosis not present

## 2015-11-11 DIAGNOSIS — R52 Pain, unspecified: Secondary | ICD-10-CM

## 2015-11-11 DIAGNOSIS — E669 Obesity, unspecified: Secondary | ICD-10-CM | POA: Diagnosis not present

## 2015-11-11 DIAGNOSIS — E039 Hypothyroidism, unspecified: Secondary | ICD-10-CM | POA: Diagnosis not present

## 2015-11-11 DIAGNOSIS — Z23 Encounter for immunization: Secondary | ICD-10-CM | POA: Diagnosis not present

## 2015-11-11 DIAGNOSIS — E213 Hyperparathyroidism, unspecified: Secondary | ICD-10-CM | POA: Diagnosis not present

## 2015-11-11 DIAGNOSIS — S92352D Displaced fracture of fifth metatarsal bone, left foot, subsequent encounter for fracture with routine healing: Secondary | ICD-10-CM | POA: Diagnosis not present

## 2015-11-15 ENCOUNTER — Ambulatory Visit
Admission: RE | Admit: 2015-11-15 | Discharge: 2015-11-15 | Disposition: A | Discharge status: Home / Self Care | Payer: Medicare Other | Source: Ambulatory Visit | Attending: Family Medicine | Admitting: Family Medicine

## 2015-11-15 ENCOUNTER — Ambulatory Visit
Admission: RE | Admit: 2015-11-15 | Discharge: 2015-11-15 | Disposition: A | Discharge status: Home / Self Care | Payer: Medicare Other | Source: Ambulatory Visit | Attending: Certified Nurse Midwife | Admitting: Certified Nurse Midwife

## 2015-11-15 DIAGNOSIS — M810 Age-related osteoporosis without current pathological fracture: Secondary | ICD-10-CM

## 2015-11-15 DIAGNOSIS — Z1231 Encounter for screening mammogram for malignant neoplasm of breast: Secondary | ICD-10-CM | POA: Diagnosis not present

## 2015-11-15 DIAGNOSIS — M8580 Other specified disorders of bone density and structure, unspecified site: Secondary | ICD-10-CM

## 2015-11-15 DIAGNOSIS — Z780 Asymptomatic menopausal state: Secondary | ICD-10-CM | POA: Diagnosis not present

## 2015-11-15 DIAGNOSIS — M8589 Other specified disorders of bone density and structure, multiple sites: Secondary | ICD-10-CM | POA: Diagnosis not present

## 2015-11-25 DIAGNOSIS — M8580 Other specified disorders of bone density and structure, unspecified site: Secondary | ICD-10-CM | POA: Diagnosis not present

## 2015-11-25 DIAGNOSIS — E210 Primary hyperparathyroidism: Secondary | ICD-10-CM | POA: Diagnosis not present

## 2015-11-25 DIAGNOSIS — E039 Hypothyroidism, unspecified: Secondary | ICD-10-CM | POA: Diagnosis not present

## 2015-11-25 DIAGNOSIS — E049 Nontoxic goiter, unspecified: Secondary | ICD-10-CM | POA: Diagnosis not present

## 2015-11-27 DIAGNOSIS — H25041 Posterior subcapsular polar age-related cataract, right eye: Secondary | ICD-10-CM | POA: Diagnosis not present

## 2016-01-24 DIAGNOSIS — E039 Hypothyroidism, unspecified: Secondary | ICD-10-CM | POA: Diagnosis not present

## 2016-01-24 DIAGNOSIS — E210 Primary hyperparathyroidism: Secondary | ICD-10-CM | POA: Diagnosis not present

## 2016-03-14 DIAGNOSIS — J22 Unspecified acute lower respiratory infection: Secondary | ICD-10-CM | POA: Diagnosis not present

## 2016-03-14 DIAGNOSIS — R52 Pain, unspecified: Secondary | ICD-10-CM | POA: Diagnosis not present

## 2016-05-27 DIAGNOSIS — Z0000 Encounter for general adult medical examination without abnormal findings: Secondary | ICD-10-CM | POA: Diagnosis not present

## 2016-05-27 DIAGNOSIS — E039 Hypothyroidism, unspecified: Secondary | ICD-10-CM | POA: Diagnosis not present

## 2016-05-27 DIAGNOSIS — E213 Hyperparathyroidism, unspecified: Secondary | ICD-10-CM | POA: Diagnosis not present

## 2016-05-27 DIAGNOSIS — Z23 Encounter for immunization: Secondary | ICD-10-CM | POA: Diagnosis not present

## 2016-05-27 DIAGNOSIS — J309 Allergic rhinitis, unspecified: Secondary | ICD-10-CM | POA: Diagnosis not present

## 2016-05-27 DIAGNOSIS — Z1389 Encounter for screening for other disorder: Secondary | ICD-10-CM | POA: Diagnosis not present

## 2016-05-27 DIAGNOSIS — E669 Obesity, unspecified: Secondary | ICD-10-CM | POA: Diagnosis not present

## 2016-05-27 DIAGNOSIS — F39 Unspecified mood [affective] disorder: Secondary | ICD-10-CM | POA: Diagnosis not present

## 2016-05-27 DIAGNOSIS — R51 Headache: Secondary | ICD-10-CM | POA: Diagnosis not present

## 2016-05-27 DIAGNOSIS — E063 Autoimmune thyroiditis: Secondary | ICD-10-CM | POA: Diagnosis not present

## 2016-05-27 DIAGNOSIS — I10 Essential (primary) hypertension: Secondary | ICD-10-CM | POA: Diagnosis not present

## 2016-10-15 ENCOUNTER — Ambulatory Visit (INDEPENDENT_AMBULATORY_CARE_PROVIDER_SITE_OTHER): Payer: Medicare Other | Admitting: Certified Nurse Midwife

## 2016-10-15 ENCOUNTER — Encounter: Payer: Self-pay | Admitting: Certified Nurse Midwife

## 2016-10-15 ENCOUNTER — Other Ambulatory Visit (HOSPITAL_COMMUNITY)
Admission: RE | Admit: 2016-10-15 | Discharge: 2016-10-15 | Disposition: A | Discharge status: Home / Self Care | Payer: Medicare Other | Source: Ambulatory Visit | Attending: Obstetrics & Gynecology | Admitting: Obstetrics & Gynecology

## 2016-10-15 VITALS — BP 122/64 | HR 68 | Temp 98.9°F | Resp 16 | Ht 59.75 in | Wt 161.0 lb

## 2016-10-15 DIAGNOSIS — N952 Postmenopausal atrophic vaginitis: Secondary | ICD-10-CM | POA: Diagnosis not present

## 2016-10-15 DIAGNOSIS — Z124 Encounter for screening for malignant neoplasm of cervix: Secondary | ICD-10-CM

## 2016-10-15 DIAGNOSIS — Z01419 Encounter for gynecological examination (general) (routine) without abnormal findings: Secondary | ICD-10-CM

## 2016-10-15 NOTE — Progress Notes (Signed)
66 y.o. G44P1001 Married  Caucasian Fe here for annual exam. Menopausal no HRT. Denies vaginal bleeding. Some vaginal dryness at times, has coconut oil for use if needed. Sees Dr. Laurann Montana for aex/ labs and hypertension/ anxiety management. All stable at this time. Working on weight loss and exercise when not traveling! Spouse had surgery this year doing well. Hypothyroid stable with Endocrine MD. Patient working on calcium in diet. No other health issues today. Visited daughter this summer!  Patient's last menstrual period was 02/23/1993.          Sexually active: No.  The current method of family planning is post menopausal status.    Exercising: Yes.    walking Smoker:  no  Health Maintenance: Pap:  09-28-13 neg History of Abnormal Pap: yes MMG:  11-15-15 category c density birads 1:neg Self Breast exams: no Colonoscopy:  2012 f/u 42yrs BMD:  2017  TDaP: 12/17 Shingles: 2014 Pneumonia: 2016 Hep C and HIV: HIV neg yrs ago Labs: none   reports that she has never smoked. She has never used smokeless tobacco. She reports that she does not drink alcohol or use drugs.  Past Medical History:  Diagnosis Date  . Heart murmur   . Hypertension   . Hypothyroid   . Kidney stones   . Osteoporosis    & osteopenia    Past Surgical History:  Procedure Laterality Date  . APPENDECTOMY    . BREAST BIOPSY Left    benign  . BREAST REDUCTION SURGERY    . CERVIX LESION DESTRUCTION N/A 1970   repeat pap smears all  normal  . GANGLION CYST EXCISION  4/12   left foot & left arm  . small bowel injury     fistula    Current Outpatient Prescriptions  Medication Sig Dispense Refill  . acetaminophen (TYLENOL) 500 MG tablet Take 1,000 mg by mouth every 6 (six) hours as needed. For pain    . aspirin 81 MG chewable tablet Chew 81 mg by mouth daily.    Marland Kitchen diltiazem (CARDIZEM CD) 180 MG 24 hr capsule Take 180 mg by mouth daily.  4  . Docusate Sodium (COLACE PO) Take 100 mg by mouth daily as needed.  constipation    . hydrochlorothiazide (MICROZIDE) 12.5 MG capsule Take 12.5 mg by mouth. Every 2-3 days  11  . ibuprofen (ADVIL,MOTRIN) 200 MG tablet Take 400 mg by mouth every 6 (six) hours as needed for moderate pain.    Marland Kitchen LORazepam (ATIVAN) 1 MG tablet Take 0.5 mg by mouth every 8 (eight) hours as needed. For anxiety    . MELATONIN PO Take 5 mg by mouth as needed.     . Multiple Vitamin (MULTIVITAMIN WITH MINERALS) TABS tablet Take 1 tablet by mouth as needed.     . Pseudoephedrine HCl (SUDAFED PO) Take by mouth.    . SYNTHROID 112 MCG tablet      No current facility-administered medications for this visit.     Family History  Problem Relation Age of Onset  . Breast cancer Mother   . Cancer Brother        leukemia  . Emphysema Father   . Heart attack Father     ROS:  Pertinent items are noted in HPI.  Otherwise, a comprehensive ROS was negative.  Exam:   BP 122/64   Pulse 68   Temp 98.9 F (37.2 C) (Oral)   Resp 16   Ht 4' 11.75" (1.518 m)   Wt 161 lb (  73 kg)   LMP 02/23/1993   BMI 31.71 kg/m  Height: 4' 11.75" (151.8 cm) Ht Readings from Last 3 Encounters:  10/15/16 4' 11.75" (1.518 m)  10/15/15 5' (1.524 m)  10/12/14 5' 0.25" (1.53 m)    General appearance: alert, cooperative and appears stated age Head: Normocephalic, without obvious abnormality, atraumatic Neck: no adenopathy, supple, symmetrical, trachea midline and thyroid normal to inspection and palpation Lungs: clear to auscultation bilaterally Breasts: normal appearance, no masses or tenderness, No nipple retraction or dimpling, No nipple discharge or bleeding, No axillary or supraclavicular adenopathy Heart: regular rate and rhythm Abdomen: soft, non-tender; no masses,  no organomegaly Extremities: extremities normal, atraumatic, no cyanosis or edema Skin: Skin color, texture, turgor normal. No rashes or lesions Lymph nodes: Cervical, supraclavicular, and axillary nodes normal. No abnormal inguinal  nodes palpated Neurologic: Grossly normal   Pelvic: External genitalia:  no lesions              Urethra:  normal appearing urethra with no masses, tenderness or lesions              Bartholin's and Skene's: normal                 Vagina: normal appearing vagina with normal color and discharge, no lesions              Cervix: multiparous appearance, no cervical motion tenderness and no lesions              Pap taken: Yes.   Bimanual Exam:  Uterus:  normal size, contour, position, consistency, mobility, non-tender              Adnexa: normal adnexa and no mass, fullness, tenderness               Rectovaginal: Confirms               Anus:  normal sphincter tone, no lesions  Chaperone present: yes  A:  Well Woman with normal exam  Menopausal no HRT   Atrophic Vaginitis uses OTC coconut oil if needed  Hypertension/Hypothyroid with MD management  Mammogram due 9/18, has scheduled  P:   Reviewed health and wellness pertinent to exam  Aware of need to advise if vaginal bleeding  Will advise if vaginal dryness increases  Continue follow up as indicated  Keep mammogram appointment    Pap smear: yes   counseled on breast self exam, mammography screening, feminine hygiene, adequate intake of calcium and vitamin D, diet and exercise, Kegel's exercises  return annually or prn  An After Visit Summary was printed and given to the patient.

## 2016-10-15 NOTE — Patient Instructions (Signed)
EXERCISE AND DIET:  We recommended that you start or continue a regular exercise program for good health. Regular exercise means any activity that makes your heart beat faster and makes you sweat.  We recommend exercising at least 30 minutes per day at least 3 days a week, preferably 4 or 5.  We also recommend a diet low in fat and sugar.  Inactivity, poor dietary choices and obesity can cause diabetes, heart attack, stroke, and kidney damage, among others.    ALCOHOL AND SMOKING:  Women should limit their alcohol intake to no more than 7 drinks/beers/glasses of wine (combined, not each!) per week. Moderation of alcohol intake to this level decreases your risk of breast cancer and liver damage. And of course, no recreational drugs are part of a healthy lifestyle.  And absolutely no smoking or even second hand smoke. Most people know smoking can cause heart and lung diseases, but did you know it also contributes to weakening of your bones? Aging of your skin?  Yellowing of your teeth and nails?  CALCIUM AND VITAMIN D:  Adequate intake of calcium and Vitamin D are recommended.  The recommendations for exact amounts of these supplements seem to change often, but generally speaking 600 mg of calcium (either carbonate or citrate) and 800 units of Vitamin D per day seems prudent. Certain women may benefit from higher intake of Vitamin D.  If you are among these women, your doctor will have told you during your visit.    PAP SMEARS:  Pap smears, to check for cervical cancer or precancers,  have traditionally been done yearly, although recent scientific advances have shown that most women can have pap smears less often.  However, every woman still should have a physical exam from her gynecologist every year. It will include a breast check, inspection of the vulva and vagina to check for abnormal growths or skin changes, a visual exam of the cervix, and then an exam to evaluate the size and shape of the uterus and  ovaries.  And after 68 years of age, a rectal exam is indicated to check for rectal cancers. We will also provide age appropriate advice regarding health maintenance, like when you should have certain vaccines, screening for sexually transmitted diseases, bone density testing, colonoscopy, mammograms, etc.   MAMMOGRAMS:  All women over 40 years old should have a yearly mammogram. Many facilities now offer a "3D" mammogram, which may cost around $50 extra out of pocket. If possible,  we recommend you accept the option to have the 3D mammogram performed.  It both reduces the number of women who will be called back for extra views which then turn out to be normal, and it is better than the routine mammogram at detecting truly abnormal areas.    COLONOSCOPY:  Colonoscopy to screen for colon cancer is recommended for all women at age 50.  We know, you hate the idea of the prep.  We agree, BUT, having colon cancer and not knowing it is worse!!  Colon cancer so often starts as a polyp that can be seen and removed at colonscopy, which can quite literally save your life!  And if your first colonoscopy is normal and you have no family history of colon cancer, most women don't have to have it again for 10 years.  Once every ten years, you can do something that may end up saving your life, right?  We will be happy to help you get it scheduled when you are ready.    Be sure to check your insurance coverage so you understand how much it will cost.  It may be covered as a preventative service at no cost, but you should check your particular policy.     Atrophic Vaginitis Atrophic vaginitis is a condition in which the tissues that line the vagina become dry and thin. This condition is most common in women who have stopped having regular menstrual periods (menopause). This usually starts when a woman is 45-55 years old. Estrogen helps to keep the vagina moist. It stimulates the vagina to produce a clear fluid that lubricates  the vagina for sexual intercourse. This fluid also protects the vagina from infection. Lack of estrogen can cause the lining of the vagina to get thinner and dryer. The vagina may also shrink in size. It may become less elastic. Atrophic vaginitis tends to get worse over time as a woman's estrogen level drops. What are the causes? This condition is caused by the normal drop in estrogen that happens around the time of menopause. What increases the risk? Certain conditions or situations may lower a woman's estrogen level, which increases her risk of atrophic vaginitis. These include:  Taking medicine that blocks estrogen.  Having ovaries removed surgically.  Being treated for cancer with X-ray treatment (radiation) or medicines (chemotherapy).  Exercising very hard and often.  Having an eating disorder (anorexia).  Giving birth or breastfeeding.  Being over the age of 50.  Smoking.  What are the signs or symptoms? Symptoms of this condition include:  Pain, soreness, or bleeding during sexual intercourse (dyspareunia).  Vaginal burning, irritation, or itching.  Pain or bleeding during a vaginal examination using a speculum (pelvic exam).  Loss of interest in sexual activity.  Having burning pain when passing urine.  Vaginal discharge that is brown or yellow.  In some cases, there are no symptoms. How is this diagnosed? This condition is diagnosed with a medical history and physical exam. This will include a pelvic exam that checks whether the inside of your vagina appears pale, thin, or dry. Rarely, you may also have other tests, including:  A urine test.  A test that checks the acid balance in your vaginal fluid (acid balance test).  How is this treated? Treatment for this condition may depend on the severity of your symptoms. Treatment may include:  Using an over-the-counter vaginal lubricant before you have sexual intercourse.  Using a long-acting vaginal  moisturizer.  Using low-dose vaginal estrogen for moderate to severe symptoms that do not respond to other treatments. Options include creams, tablets, and inserts (vaginal rings). Before using vaginal estrogen, tell your health care provider if you have a history of: ? Breast cancer. ? Endometrial cancer. ? Blood clots.  Taking medicines. You may be able to take a daily pill for dyspareunia. Discuss all of the risks of this medicine with your health care provider. It is usually not recommended for women who have a family history or personal history of breast cancer.  If your symptoms are very mild and you are not sexually active, you may not need treatment. Follow these instructions at home:  Take medicines only as directed by your health care provider. Do not use herbal or alternative medicines unless your health care provider says that you can.  Use over-the-counter creams, lubricants, or moisturizers for dryness only as directed by your health care provider.  If your atrophic vaginitis is caused by menopause, discuss all of your menopausal symptoms and treatment options with your health care   provider.  Do not douche.  Do not use products that can make your vagina dry. These include: ? Scented feminine sprays. ? Scented tampons. ? Scented soaps.  If it hurts to have sex, talk with your sexual partner. Contact a health care provider if:  Your discharge looks different than normal.  Your vagina has an unusual smell.  You have new symptoms.  Your symptoms do not improve with treatment.  Your symptoms get worse. This information is not intended to replace advice given to you by your health care provider. Make sure you discuss any questions you have with your health care provider. Document Released: 06/26/2014 Document Revised: 07/18/2015 Document Reviewed: 01/31/2014 Elsevier Interactive Patient Education  2018 [employer]  

## 2016-10-16 LAB — CYTOLOGY - PAP
Adequacy: ABSENT
Diagnosis: NEGATIVE

## 2016-10-19 ENCOUNTER — Other Ambulatory Visit: Payer: Self-pay | Admitting: Family Medicine

## 2016-10-19 DIAGNOSIS — Z1231 Encounter for screening mammogram for malignant neoplasm of breast: Secondary | ICD-10-CM

## 2016-11-16 DIAGNOSIS — Z23 Encounter for immunization: Secondary | ICD-10-CM | POA: Diagnosis not present

## 2016-11-24 DIAGNOSIS — E210 Primary hyperparathyroidism: Secondary | ICD-10-CM | POA: Diagnosis not present

## 2016-11-24 DIAGNOSIS — R7301 Impaired fasting glucose: Secondary | ICD-10-CM | POA: Diagnosis not present

## 2016-11-24 DIAGNOSIS — E039 Hypothyroidism, unspecified: Secondary | ICD-10-CM | POA: Diagnosis not present

## 2016-11-24 DIAGNOSIS — L680 Hirsutism: Secondary | ICD-10-CM | POA: Diagnosis not present

## 2016-11-24 DIAGNOSIS — E049 Nontoxic goiter, unspecified: Secondary | ICD-10-CM | POA: Diagnosis not present

## 2016-11-24 DIAGNOSIS — L659 Nonscarring hair loss, unspecified: Secondary | ICD-10-CM | POA: Diagnosis not present

## 2016-11-25 ENCOUNTER — Ambulatory Visit
Admission: RE | Admit: 2016-11-25 | Discharge: 2016-11-25 | Disposition: A | Discharge status: Home / Self Care | Payer: Medicare Other | Source: Ambulatory Visit | Attending: Family Medicine | Admitting: Family Medicine

## 2016-11-25 DIAGNOSIS — Z1231 Encounter for screening mammogram for malignant neoplasm of breast: Secondary | ICD-10-CM | POA: Diagnosis not present

## 2016-11-30 DIAGNOSIS — F419 Anxiety disorder, unspecified: Secondary | ICD-10-CM | POA: Diagnosis not present

## 2016-11-30 DIAGNOSIS — J309 Allergic rhinitis, unspecified: Secondary | ICD-10-CM | POA: Diagnosis not present

## 2016-11-30 DIAGNOSIS — R040 Epistaxis: Secondary | ICD-10-CM | POA: Diagnosis not present

## 2016-11-30 DIAGNOSIS — I10 Essential (primary) hypertension: Secondary | ICD-10-CM | POA: Diagnosis not present

## 2016-12-25 DIAGNOSIS — M7061 Trochanteric bursitis, right hip: Secondary | ICD-10-CM | POA: Diagnosis not present

## 2016-12-25 DIAGNOSIS — M545 Low back pain: Secondary | ICD-10-CM | POA: Diagnosis not present

## 2017-01-06 DIAGNOSIS — D235 Other benign neoplasm of skin of trunk: Secondary | ICD-10-CM | POA: Diagnosis not present

## 2017-01-25 DIAGNOSIS — E210 Primary hyperparathyroidism: Secondary | ICD-10-CM | POA: Diagnosis not present

## 2017-01-25 DIAGNOSIS — E039 Hypothyroidism, unspecified: Secondary | ICD-10-CM | POA: Diagnosis not present

## 2017-03-22 DIAGNOSIS — J069 Acute upper respiratory infection, unspecified: Secondary | ICD-10-CM | POA: Diagnosis not present

## 2017-03-27 DIAGNOSIS — J069 Acute upper respiratory infection, unspecified: Secondary | ICD-10-CM | POA: Diagnosis not present

## 2017-03-27 DIAGNOSIS — R509 Fever, unspecified: Secondary | ICD-10-CM | POA: Diagnosis not present

## 2017-04-06 DIAGNOSIS — H25041 Posterior subcapsular polar age-related cataract, right eye: Secondary | ICD-10-CM | POA: Diagnosis not present

## 2017-04-07 DIAGNOSIS — J069 Acute upper respiratory infection, unspecified: Secondary | ICD-10-CM | POA: Diagnosis not present

## 2017-05-11 DIAGNOSIS — L300 Nummular dermatitis: Secondary | ICD-10-CM | POA: Diagnosis not present

## 2017-05-26 DIAGNOSIS — D485 Neoplasm of uncertain behavior of skin: Secondary | ICD-10-CM | POA: Diagnosis not present

## 2017-05-26 DIAGNOSIS — D1801 Hemangioma of skin and subcutaneous tissue: Secondary | ICD-10-CM | POA: Diagnosis not present

## 2017-05-26 DIAGNOSIS — D225 Melanocytic nevi of trunk: Secondary | ICD-10-CM | POA: Diagnosis not present

## 2017-05-26 DIAGNOSIS — D235 Other benign neoplasm of skin of trunk: Secondary | ICD-10-CM | POA: Diagnosis not present

## 2017-05-26 DIAGNOSIS — L300 Nummular dermatitis: Secondary | ICD-10-CM | POA: Diagnosis not present

## 2017-05-26 DIAGNOSIS — L821 Other seborrheic keratosis: Secondary | ICD-10-CM | POA: Diagnosis not present

## 2017-05-26 DIAGNOSIS — D2272 Melanocytic nevi of left lower limb, including hip: Secondary | ICD-10-CM | POA: Diagnosis not present

## 2017-05-26 DIAGNOSIS — D2261 Melanocytic nevi of right upper limb, including shoulder: Secondary | ICD-10-CM | POA: Diagnosis not present

## 2017-06-04 DIAGNOSIS — D485 Neoplasm of uncertain behavior of skin: Secondary | ICD-10-CM | POA: Diagnosis not present

## 2017-06-04 DIAGNOSIS — L988 Other specified disorders of the skin and subcutaneous tissue: Secondary | ICD-10-CM | POA: Diagnosis not present

## 2017-06-30 DIAGNOSIS — Z0000 Encounter for general adult medical examination without abnormal findings: Secondary | ICD-10-CM | POA: Diagnosis not present

## 2017-06-30 DIAGNOSIS — E063 Autoimmune thyroiditis: Secondary | ICD-10-CM | POA: Diagnosis not present

## 2017-06-30 DIAGNOSIS — F39 Unspecified mood [affective] disorder: Secondary | ICD-10-CM | POA: Diagnosis not present

## 2017-06-30 DIAGNOSIS — J309 Allergic rhinitis, unspecified: Secondary | ICD-10-CM | POA: Diagnosis not present

## 2017-06-30 DIAGNOSIS — R7309 Other abnormal glucose: Secondary | ICD-10-CM | POA: Diagnosis not present

## 2017-06-30 DIAGNOSIS — E669 Obesity, unspecified: Secondary | ICD-10-CM | POA: Diagnosis not present

## 2017-06-30 DIAGNOSIS — I10 Essential (primary) hypertension: Secondary | ICD-10-CM | POA: Diagnosis not present

## 2017-06-30 DIAGNOSIS — Z1159 Encounter for screening for other viral diseases: Secondary | ICD-10-CM | POA: Diagnosis not present

## 2017-06-30 DIAGNOSIS — E039 Hypothyroidism, unspecified: Secondary | ICD-10-CM | POA: Diagnosis not present

## 2017-07-05 DIAGNOSIS — J069 Acute upper respiratory infection, unspecified: Secondary | ICD-10-CM | POA: Diagnosis not present

## 2017-10-27 ENCOUNTER — Other Ambulatory Visit: Payer: Self-pay

## 2017-10-27 ENCOUNTER — Ambulatory Visit (INDEPENDENT_AMBULATORY_CARE_PROVIDER_SITE_OTHER): Payer: Medicare Other | Admitting: Certified Nurse Midwife

## 2017-10-27 ENCOUNTER — Encounter: Payer: Self-pay | Admitting: Certified Nurse Midwife

## 2017-10-27 VITALS — BP 116/64 | HR 68 | Resp 16 | Ht 59.75 in | Wt 160.0 lb

## 2017-10-27 DIAGNOSIS — Z01419 Encounter for gynecological examination (general) (routine) without abnormal findings: Secondary | ICD-10-CM

## 2017-10-27 DIAGNOSIS — Z124 Encounter for screening for malignant neoplasm of cervix: Secondary | ICD-10-CM

## 2017-10-27 DIAGNOSIS — E2839 Other primary ovarian failure: Secondary | ICD-10-CM

## 2017-10-27 DIAGNOSIS — M8580 Other specified disorders of bone density and structure, unspecified site: Secondary | ICD-10-CM

## 2017-10-27 DIAGNOSIS — Z780 Asymptomatic menopausal state: Secondary | ICD-10-CM | POA: Diagnosis not present

## 2017-10-27 NOTE — Patient Instructions (Signed)

## 2017-10-27 NOTE — Progress Notes (Signed)
67 y.o. G42P1001 Married  Caucasian Fe here for annual exam. Menopausal no vaginal bleeding. Some vaginal dryness, but no issues. Sees PCP, Kelton Pillar  yearly for hypertension, anxiety management and  Labs. Hypothyroid stable with endocrine management. Still working as Government social research officer prn. Some occasional hair loss, but no change. Working on walking to help with bone strength. Due BMD this year, previous showed Osteopenia. Has mammogram scheduled soon. No other health issues today.  Patient's last menstrual period was 02/23/1993.          Sexually active: No.  The current method of family planning is post menopausal status.    Exercising: Yes.    walking Smoker:  no  Review of Systems  Constitutional: Negative.   HENT: Negative.   Eyes: Negative.   Respiratory: Negative.   Cardiovascular: Negative.   Gastrointestinal: Positive for constipation.  Genitourinary: Negative.   Musculoskeletal: Negative.   Skin:       Breast pain, lumps & itching, hair loss  Neurological: Negative.   Endo/Heme/Allergies: Negative.   Psychiatric/Behavioral: Negative.     Health Maintenance: Pap:  09-28-13 neg, 10-15-16 neg History of Abnormal Pap: yes MMG:  11-26-16 category b density birads 1:neg Self Breast exams: yes Colonoscopy:  2012 f/u 31yrs BMD:   2017 TDaP:  12/17 Shingles: 2014 Pneumonia: 2016 Hep C and HIV: HIV neg yrs ago Labs: if needed   reports that she has never smoked. She has never used smokeless tobacco. She reports that she does not drink alcohol or use drugs.  Past Medical History:  Diagnosis Date  . Abnormal Pap smear of cervix   . Heart murmur   . Hypertension   . Hypothyroid   . Kidney stones   . Osteoporosis    & osteopenia    Past Surgical History:  Procedure Laterality Date  . APPENDECTOMY    . BREAST BIOPSY Left    benign  . BREAST REDUCTION SURGERY    . CERVIX LESION DESTRUCTION N/A 1970   repeat pap smears all  normal  . GANGLION CYST EXCISION  4/12   left  foot & left arm  . small bowel injury     fistula    Current Outpatient Medications  Medication Sig Dispense Refill  . acetaminophen (TYLENOL) 500 MG tablet Take 1,000 mg by mouth every 6 (six) hours as needed. For pain    . aspirin 81 MG chewable tablet Chew 81 mg by mouth daily.    Marland Kitchen diltiazem (CARDIZEM CD) 180 MG 24 hr capsule Take 180 mg by mouth daily.  4  . Docusate Sodium (COLACE PO) Take 100 mg by mouth daily as needed. constipation    . hydrochlorothiazide (MICROZIDE) 12.5 MG capsule Take 12.5 mg by mouth. Every 2-3 days  11  . ibuprofen (ADVIL,MOTRIN) 200 MG tablet Take 400 mg by mouth every 6 (six) hours as needed for moderate pain.    Marland Kitchen LORazepam (ATIVAN) 1 MG tablet Take 0.5 mg by mouth every 8 (eight) hours as needed. For anxiety    . MELATONIN PO Take 5 mg by mouth as needed.     . Multiple Vitamin (MULTIVITAMIN WITH MINERALS) TABS tablet Take 1 tablet by mouth as needed.     . Pseudoephedrine HCl (SUDAFED PO) Take by mouth.    . SYNTHROID 112 MCG tablet      No current facility-administered medications for this visit.     Family History  Problem Relation Age of Onset  . Breast cancer Mother   .  Cancer Brother        leukemia  . Emphysema Father   . Heart attack Father     ROS:  Pertinent items are noted in HPI.  Otherwise, a comprehensive ROS was negative.  Exam:   LMP 02/23/1993    Ht Readings from Last 3 Encounters:  10/15/16 4' 11.75" (1.518 m)  10/15/15 5' (1.524 m)  10/12/14 5' 0.25" (1.53 m)    General appearance: alert, cooperative and appears stated age Head: Normocephalic, without obvious abnormality, atraumatic Neck: no adenopathy, supple, symmetrical, trachea midline and thyroid normal to inspection and palpation Lungs: clear to auscultation bilaterally Breasts: normal appearance, no masses or tenderness, No nipple retraction or dimpling, No nipple discharge or bleeding, No axillary or supraclavicular adenopathy, reduction scarring noted Heart:  regular rate and rhythm Abdomen: soft, non-tender; no masses,  no organomegaly Extremities: extremities normal, atraumatic, no cyanosis or edema Skin: Skin color, texture, turgor normal. No rashes or lesions Lymph nodes: Cervical, supraclavicular, and axillary nodes normal. No abnormal inguinal nodes palpated Neurologic: Grossly normal   Pelvic: External genitalia:  no lesions              Urethra:  normal appearing urethra with no masses, tenderness or lesions              Bartholin's and Skene's: normal                 Vagina: normal appearing vagina with normal color and discharge, no lesions              Cervix: no cervical motion tenderness, no lesions and normal appearance              Pap taken: No. Bimanual Exam:  Uterus:  normal size, contour, position, consistency, mobility, non-tender and anteverted              Adnexa: normal adnexa and no mass, fullness, tenderness               Rectovaginal: Confirms               Anus:  normal sphincter tone, small  Hemorrhoid tag noted  Chaperone present: yes  A:  Well Woman with normal exam  Menopausal no  HRT  Hypertension,hypothyroid, anxiety with MD management  BMD due, history of Osteopenia  P:   Reviewed health and wellness pertinent to exam  Aware of need to advise if vaginal bleeding  Continue follow up with MD as indicated  Discussed BMD due and could do with mammogram. Order placed, patient to call and schedule.  Work on good diet of calcium and vitamin d foods and regular exercise for bone health.  Pap smear: no   counseled on breast self exam, mammography screening, feminine hygiene, adequate intake of calcium and vitamin D, diet and exercise  return annually or prn  An After Visit Summary was printed and given to the patient.

## 2017-10-28 ENCOUNTER — Other Ambulatory Visit: Payer: Self-pay | Admitting: Family Medicine

## 2017-10-28 DIAGNOSIS — Z1231 Encounter for screening mammogram for malignant neoplasm of breast: Secondary | ICD-10-CM

## 2017-11-10 DIAGNOSIS — Z23 Encounter for immunization: Secondary | ICD-10-CM | POA: Diagnosis not present

## 2017-11-24 DIAGNOSIS — E7800 Pure hypercholesterolemia, unspecified: Secondary | ICD-10-CM | POA: Diagnosis not present

## 2017-11-24 DIAGNOSIS — R7301 Impaired fasting glucose: Secondary | ICD-10-CM | POA: Diagnosis not present

## 2017-11-24 DIAGNOSIS — E049 Nontoxic goiter, unspecified: Secondary | ICD-10-CM | POA: Diagnosis not present

## 2017-11-24 DIAGNOSIS — I10 Essential (primary) hypertension: Secondary | ICD-10-CM | POA: Diagnosis not present

## 2017-11-24 DIAGNOSIS — E669 Obesity, unspecified: Secondary | ICD-10-CM | POA: Diagnosis not present

## 2017-11-24 DIAGNOSIS — E039 Hypothyroidism, unspecified: Secondary | ICD-10-CM | POA: Diagnosis not present

## 2017-11-24 DIAGNOSIS — L659 Nonscarring hair loss, unspecified: Secondary | ICD-10-CM | POA: Diagnosis not present

## 2017-11-24 DIAGNOSIS — E210 Primary hyperparathyroidism: Secondary | ICD-10-CM | POA: Diagnosis not present

## 2017-11-24 DIAGNOSIS — M8580 Other specified disorders of bone density and structure, unspecified site: Secondary | ICD-10-CM | POA: Diagnosis not present

## 2017-11-29 ENCOUNTER — Ambulatory Visit
Admission: RE | Admit: 2017-11-29 | Discharge: 2017-11-29 | Disposition: A | Discharge status: Home / Self Care | Payer: Medicare Other | Source: Ambulatory Visit | Attending: Family Medicine | Admitting: Family Medicine

## 2017-11-29 DIAGNOSIS — Z1231 Encounter for screening mammogram for malignant neoplasm of breast: Secondary | ICD-10-CM | POA: Diagnosis not present

## 2017-12-27 ENCOUNTER — Other Ambulatory Visit: Payer: Medicare Other

## 2017-12-27 ENCOUNTER — Ambulatory Visit
Admission: RE | Admit: 2017-12-27 | Discharge: 2017-12-27 | Disposition: A | Discharge status: Home / Self Care | Payer: Medicare Other | Source: Ambulatory Visit | Attending: Certified Nurse Midwife | Admitting: Certified Nurse Midwife

## 2017-12-27 DIAGNOSIS — E2839 Other primary ovarian failure: Secondary | ICD-10-CM

## 2018-02-09 ENCOUNTER — Other Ambulatory Visit: Payer: Medicare Other

## 2018-02-11 DIAGNOSIS — S0502XA Injury of conjunctiva and corneal abrasion without foreign body, left eye, initial encounter: Secondary | ICD-10-CM | POA: Diagnosis not present

## 2018-04-07 DIAGNOSIS — H5213 Myopia, bilateral: Secondary | ICD-10-CM | POA: Diagnosis not present

## 2018-04-07 DIAGNOSIS — H3500 Unspecified background retinopathy: Secondary | ICD-10-CM | POA: Diagnosis not present

## 2018-06-13 DIAGNOSIS — M7062 Trochanteric bursitis, left hip: Secondary | ICD-10-CM | POA: Diagnosis not present

## 2018-07-25 DIAGNOSIS — E669 Obesity, unspecified: Secondary | ICD-10-CM | POA: Diagnosis not present

## 2018-07-25 DIAGNOSIS — R7303 Prediabetes: Secondary | ICD-10-CM | POA: Diagnosis not present

## 2018-07-25 DIAGNOSIS — F419 Anxiety disorder, unspecified: Secondary | ICD-10-CM | POA: Diagnosis not present

## 2018-07-25 DIAGNOSIS — Z1389 Encounter for screening for other disorder: Secondary | ICD-10-CM | POA: Diagnosis not present

## 2018-07-25 DIAGNOSIS — Z0000 Encounter for general adult medical examination without abnormal findings: Secondary | ICD-10-CM | POA: Diagnosis not present

## 2018-07-25 DIAGNOSIS — E039 Hypothyroidism, unspecified: Secondary | ICD-10-CM | POA: Diagnosis not present

## 2018-07-25 DIAGNOSIS — E213 Hyperparathyroidism, unspecified: Secondary | ICD-10-CM | POA: Diagnosis not present

## 2018-07-25 DIAGNOSIS — E063 Autoimmune thyroiditis: Secondary | ICD-10-CM | POA: Diagnosis not present

## 2018-07-25 DIAGNOSIS — I10 Essential (primary) hypertension: Secondary | ICD-10-CM | POA: Diagnosis not present

## 2018-07-25 DIAGNOSIS — J309 Allergic rhinitis, unspecified: Secondary | ICD-10-CM | POA: Diagnosis not present

## 2018-08-22 DIAGNOSIS — R7303 Prediabetes: Secondary | ICD-10-CM | POA: Diagnosis not present

## 2018-08-22 DIAGNOSIS — E213 Hyperparathyroidism, unspecified: Secondary | ICD-10-CM | POA: Diagnosis not present

## 2018-08-22 DIAGNOSIS — I10 Essential (primary) hypertension: Secondary | ICD-10-CM | POA: Diagnosis not present

## 2018-08-22 DIAGNOSIS — E039 Hypothyroidism, unspecified: Secondary | ICD-10-CM | POA: Diagnosis not present

## 2018-10-26 NOTE — Progress Notes (Deleted)
68 y.o. G1P1001 Married  {Race/ethnicity:17218} Fe here for annual exam.    Patient's last menstrual period was 02/23/1993.          Sexually active: {yes no:314532}  The current method of family planning is post menopausal status.    Exercising: {yes no:314532}  {types:19826} Smoker:  {YES NO:22349}  ROS  Health Maintenance: Pap:  10-15-16 neg History of Abnormal Pap: {YES NO:22349} MMG:  11-29-17 category c density birads 1:neg Self Breast exams: {YES NO:22349} Colonoscopy:  2012 f/u 28yrs BMD:   2019 TDaP:  2017 Shingles: 2014 Pneumonia: 2016 Hep C and HIV: HIV neg yrs ago per patient Labs: ***   reports that she has never smoked. She has never used smokeless tobacco. She reports current alcohol use. She reports that she does not use drugs.  Past Medical History:  Diagnosis Date  . Abnormal Pap smear of cervix   . Heart murmur   . Hypertension   . Hypothyroid   . Kidney stones   . Osteoporosis    & osteopenia    Past Surgical History:  Procedure Laterality Date  . APPENDECTOMY    . BREAST BIOPSY Left    benign  . BREAST REDUCTION SURGERY    . CERVIX LESION DESTRUCTION N/A 1970   repeat pap smears all  normal  . GANGLION CYST EXCISION  4/12   left foot & left arm  . REDUCTION MAMMAPLASTY    . small bowel injury     fistula    Current Outpatient Medications  Medication Sig Dispense Refill  . acetaminophen (TYLENOL) 500 MG tablet Take 1,000 mg by mouth every 6 (six) hours as needed. For pain    . aspirin 81 MG chewable tablet Chew 81 mg by mouth daily.    Marland Kitchen diltiazem (CARDIZEM CD) 180 MG 24 hr capsule Take 180 mg by mouth daily.  4  . Docusate Sodium (COLACE PO) Take 100 mg by mouth daily as needed. constipation    . hydrochlorothiazide (MICROZIDE) 12.5 MG capsule Take 12.5 mg by mouth. Every 2-3 days  11  . ibuprofen (ADVIL,MOTRIN) 200 MG tablet Take 400 mg by mouth every 6 (six) hours as needed for moderate pain.    Marland Kitchen LORazepam (ATIVAN) 1 MG tablet Take 0.5  mg by mouth every 8 (eight) hours as needed. For anxiety    . MELATONIN PO Take 5 mg by mouth as needed.     . Multiple Vitamin (MULTIVITAMIN WITH MINERALS) TABS tablet Take 1 tablet by mouth as needed.     . Pseudoephedrine HCl (SUDAFED PO) Take by mouth.    . SYNTHROID 125 MCG tablet      No current facility-administered medications for this visit.     Family History  Problem Relation Age of Onset  . Breast cancer Mother   . Cancer Brother        leukemia  . Emphysema Father   . Heart attack Father     ROS:  Pertinent items are noted in HPI.  Otherwise, a comprehensive ROS was negative.  Exam:   LMP 02/23/1993    Ht Readings from Last 3 Encounters:  10/27/17 4' 11.75" (1.518 m)  10/15/16 4' 11.75" (1.518 m)  10/15/15 5' (1.524 m)    General appearance: alert, cooperative and appears stated age Head: Normocephalic, without obvious abnormality, atraumatic Neck: no adenopathy, supple, symmetrical, trachea midline and thyroid {EXAM; THYROID:18604} Lungs: clear to auscultation bilaterally Breasts: {Exam; breast:13139::"normal appearance, no masses or tenderness"} Heart: regular rate and  rhythm Abdomen: soft, non-tender; no masses,  no organomegaly Extremities: extremities normal, atraumatic, no cyanosis or edema Skin: Skin color, texture, turgor normal. No rashes or lesions Lymph nodes: Cervical, supraclavicular, and axillary nodes normal. No abnormal inguinal nodes palpated Neurologic: Grossly normal   Pelvic: External genitalia:  no lesions              Urethra:  normal appearing urethra with no masses, tenderness or lesions              Bartholin's and Skene's: normal                 Vagina: normal appearing vagina with normal color and discharge, no lesions              Cervix: {exam; cervix:14595}              Pap taken: {yes no:314532} Bimanual Exam:  Uterus:  {exam; uterus:12215}              Adnexa: {exam; adnexa:12223}               Rectovaginal: Confirms                Anus:  normal sphincter tone, no lesions  Chaperone present: ***  A:  Well Woman with normal exam  P:   Reviewed health and wellness pertinent to exam  Pap smear: {YES NO:22349}  {plan; gyn:5269::"mammogram","pap smear","return annually or prn"}  An After Visit Summary was printed and given to the patient.

## 2018-10-27 ENCOUNTER — Other Ambulatory Visit: Payer: Self-pay

## 2018-11-01 ENCOUNTER — Ambulatory Visit: Payer: Federal, State, Local not specified - PPO | Admitting: Certified Nurse Midwife

## 2018-11-05 DIAGNOSIS — Z23 Encounter for immunization: Secondary | ICD-10-CM | POA: Diagnosis not present

## 2018-11-15 ENCOUNTER — Other Ambulatory Visit: Payer: Self-pay | Admitting: Family Medicine

## 2018-11-15 DIAGNOSIS — Z1231 Encounter for screening mammogram for malignant neoplasm of breast: Secondary | ICD-10-CM

## 2018-12-08 DIAGNOSIS — L658 Other specified nonscarring hair loss: Secondary | ICD-10-CM | POA: Diagnosis not present

## 2018-12-26 DIAGNOSIS — Z1211 Encounter for screening for malignant neoplasm of colon: Secondary | ICD-10-CM | POA: Diagnosis not present

## 2018-12-26 DIAGNOSIS — I10 Essential (primary) hypertension: Secondary | ICD-10-CM | POA: Diagnosis not present

## 2018-12-26 DIAGNOSIS — M545 Low back pain: Secondary | ICD-10-CM | POA: Diagnosis not present

## 2019-01-02 ENCOUNTER — Ambulatory Visit
Admission: RE | Admit: 2019-01-02 | Discharge: 2019-01-02 | Disposition: A | Discharge status: Home / Self Care | Payer: Medicare Other | Source: Ambulatory Visit | Attending: Family Medicine | Admitting: Family Medicine

## 2019-01-02 ENCOUNTER — Other Ambulatory Visit: Payer: Self-pay

## 2019-01-02 DIAGNOSIS — Z1231 Encounter for screening mammogram for malignant neoplasm of breast: Secondary | ICD-10-CM

## 2019-01-02 DIAGNOSIS — Z1211 Encounter for screening for malignant neoplasm of colon: Secondary | ICD-10-CM | POA: Diagnosis not present

## 2019-03-13 DIAGNOSIS — I10 Essential (primary) hypertension: Secondary | ICD-10-CM | POA: Diagnosis not present

## 2019-03-13 DIAGNOSIS — E213 Hyperparathyroidism, unspecified: Secondary | ICD-10-CM | POA: Diagnosis not present

## 2019-03-13 DIAGNOSIS — R7303 Prediabetes: Secondary | ICD-10-CM | POA: Diagnosis not present

## 2019-03-13 DIAGNOSIS — F419 Anxiety disorder, unspecified: Secondary | ICD-10-CM | POA: Diagnosis not present

## 2019-03-13 DIAGNOSIS — E039 Hypothyroidism, unspecified: Secondary | ICD-10-CM | POA: Diagnosis not present

## 2019-04-07 DIAGNOSIS — Z23 Encounter for immunization: Secondary | ICD-10-CM | POA: Diagnosis not present

## 2019-04-11 DIAGNOSIS — H5213 Myopia, bilateral: Secondary | ICD-10-CM | POA: Diagnosis not present

## 2019-04-11 DIAGNOSIS — H2513 Age-related nuclear cataract, bilateral: Secondary | ICD-10-CM | POA: Diagnosis not present

## 2019-05-01 ENCOUNTER — Ambulatory Visit (INDEPENDENT_AMBULATORY_CARE_PROVIDER_SITE_OTHER): Payer: Medicare Other | Admitting: Certified Nurse Midwife

## 2019-05-01 ENCOUNTER — Other Ambulatory Visit: Payer: Self-pay

## 2019-05-01 ENCOUNTER — Encounter: Payer: Self-pay | Admitting: Certified Nurse Midwife

## 2019-05-01 ENCOUNTER — Other Ambulatory Visit (HOSPITAL_COMMUNITY)
Admission: RE | Admit: 2019-05-01 | Discharge: 2019-05-01 | Disposition: A | Discharge status: Home / Self Care | Payer: Medicare Other | Source: Ambulatory Visit | Attending: Obstetrics & Gynecology | Admitting: Obstetrics & Gynecology

## 2019-05-01 VITALS — BP 118/80 | HR 70 | Temp 97.3°F | Resp 16 | Ht 59.75 in | Wt 163.0 lb

## 2019-05-01 DIAGNOSIS — Z124 Encounter for screening for malignant neoplasm of cervix: Secondary | ICD-10-CM | POA: Insufficient documentation

## 2019-05-01 DIAGNOSIS — Z1151 Encounter for screening for human papillomavirus (HPV): Secondary | ICD-10-CM | POA: Diagnosis not present

## 2019-05-01 DIAGNOSIS — Z780 Asymptomatic menopausal state: Secondary | ICD-10-CM | POA: Insufficient documentation

## 2019-05-01 DIAGNOSIS — Z01419 Encounter for gynecological examination (general) (routine) without abnormal findings: Secondary | ICD-10-CM | POA: Diagnosis not present

## 2019-05-01 DIAGNOSIS — Z8739 Personal history of other diseases of the musculoskeletal system and connective tissue: Secondary | ICD-10-CM

## 2019-05-01 NOTE — Patient Instructions (Signed)
EXERCISE AND DIET:  We recommended that you start or continue a regular exercise program for good health. Regular exercise means any activity that makes your heart beat faster and makes you sweat.  We recommend exercising at least 30 minutes per day at least 3 days a week, preferably 4 or 5.  We also recommend a diet low in fat and sugar.  Inactivity, poor dietary choices and obesity can cause diabetes, heart attack, stroke, and kidney damage, among others.    ALCOHOL AND SMOKING:  Women should limit their alcohol intake to no more than 7 drinks/beers/glasses of wine (combined, not each!) per week. Moderation of alcohol intake to this level decreases your risk of breast cancer and liver damage. And of course, no recreational drugs are part of a healthy lifestyle.  And absolutely no smoking or even second hand smoke. Most people know smoking can cause heart and lung diseases, but did you know it also contributes to weakening of your bones? Aging of your skin?  Yellowing of your teeth and nails?  CALCIUM AND VITAMIN D:  Adequate intake of calcium and Vitamin D are recommended.  The recommendations for exact amounts of these supplements seem to change often, but generally speaking 600 mg of calcium (either carbonate or citrate) and 800 units of Vitamin D per day seems prudent. Certain women may benefit from higher intake of Vitamin D.  If you are among these women, your doctor will have told you during your visit.    PAP SMEARS:  Pap smears, to check for cervical cancer or precancers,  have traditionally been done yearly, although recent scientific advances have shown that most women can have pap smears less often.  However, every woman still should have a physical exam from her gynecologist every year. It will include a breast check, inspection of the vulva and vagina to check for abnormal growths or skin changes, a visual exam of the cervix, and then an exam to evaluate the size and shape of the uterus and  ovaries.  And after 71 years of age, a rectal exam is indicated to check for rectal cancers. We will also provide age appropriate advice regarding health maintenance, like when you should have certain vaccines, screening for sexually transmitted diseases, bone density testing, colonoscopy, mammograms, etc.   MAMMOGRAMS:  All women over 40 years old should have a yearly mammogram. Many facilities now offer a "3D" mammogram, which may cost around $50 extra out of pocket. If possible,  we recommend you accept the option to have the 3D mammogram performed.  It both reduces the number of women who will be called back for extra views which then turn out to be normal, and it is better than the routine mammogram at detecting truly abnormal areas.    COLONOSCOPY:  Colonoscopy to screen for colon cancer is recommended for all women at age 50.  We know, you hate the idea of the prep.  We agree, BUT, having colon cancer and not knowing it is worse!!  Colon cancer so often starts as a polyp that can be seen and removed at colonscopy, which can quite literally save your life!  And if your first colonoscopy is normal and you have no family history of colon cancer, most women don't have to have it again for 10 years.  Once every ten years, you can do something that may end up saving your life, right?  We will be happy to help you get it scheduled when you are ready.    Be sure to check your insurance coverage so you understand how much it will cost.  It may be covered as a preventative service at no cost, but you should check your particular policy.      Bone Density Test The bone density test uses a special type of X-ray to measure the amount of calcium and other minerals in your bones. It can measure bone density in the hip and the spine. The test procedure is similar to having a regular X-ray. This test may also be called:  Bone densitometry.  Bone mineral density test.  Dual-energy X-ray absorptiometry (DEXA). You  may have this test to:  Diagnose a condition that causes weak or thin bones (osteoporosis).  Screen you for osteoporosis.  Predict your risk for a broken bone (fracture).  Determine how well your osteoporosis treatment is working. Tell a health care provider about:  Any allergies you have.  All medicines you are taking, including vitamins, herbs, eye drops, creams, and over-the-counter medicines.  Any problems you or family members have had with anesthetic medicines.  Any blood disorders you have.  Any surgeries you have had.  Any medical conditions you have.  Whether you are pregnant or may be pregnant.  Any medical tests you have had within the past 14 days that used contrast material. What are the risks? Generally, this is a safe procedure. However, it does expose you to a small amount of radiation, which can slightly increase your cancer risk. What happens before the procedure?  Do not take any calcium supplements starting 24 hours before your test.  Remove all metal jewelry, eyeglasses, dental appliances, and any other metal objects. What happens during the procedure?   You will lie down on an exam table. There will be an X-ray generator below you and an imaging device above you.  Other devices, such as boxes or braces, may be used to position your body properly for the scan.  The machine will slowly scan your body. You will need to keep still.  The images will show up on a screen in the room. Images will be examined by a specialist after your test is done. The procedure may vary among health care providers and hospitals. What happens after the procedure?  It is up to you to get your test results. Ask your health care provider, or the department that is doing the test, when your results will be ready. Summary  A bone density test is an imaging test that uses a type of X-ray to measure the amount of calcium and other minerals in your bones.  The test may be used  to diagnose or screen you for a condition that causes weak or thin bones (osteoporosis), predict your risk for a broken bone (fracture), or determine how well your osteoporosis treatment is working.  Do not take any calcium supplements starting 24 hours before your test.  Ask your health care provider, or the department that is doing the test, when your results will be ready. This information is not intended to replace advice given to you by your health care provider. Make sure you discuss any questions you have with your health care provider. Document Revised: 02/25/2017 Document Reviewed: 12/14/2016 Elsevier Patient Education  [employer].

## 2019-05-01 NOTE — Progress Notes (Signed)
69 y.o. G41P1001 Married  Caucasian Fe here for annual exam. Post menopausal no HRT.Denies vaginal bleeding or vaginal dryness. Sees Kelton Pillar PCP for anxiety and hypertension, hypothyroid management. All labs normal per patient. Trying to eat good diet for weight management. No health issues today.  Patient's last menstrual period was 02/23/1993.          Sexually active: No.  The current method of family planning is post menopausal status & husband vasectomy.    Exercising: Yes.    walk the dog Smoker:  no  Review of Systems  Constitutional: Negative.   HENT: Negative.   Eyes: Negative.   Respiratory: Negative.   Cardiovascular: Negative.   Gastrointestinal: Negative.   Genitourinary: Negative.   Musculoskeletal: Negative.   Skin:       Breast tenderness  Neurological: Negative.   Endo/Heme/Allergies: Negative.   Psychiatric/Behavioral: Negative.     Health Maintenance: Pap:  10-15-16 neg, Pap smear today. History of Abnormal Pap: yes MMG:  01-02-2019 category c density birads 1:neg Self Breast exams: yes Colonoscopy:  2012 f/u 68yrs, not done, patient declines and had IFOB done with PCP negative per patient. BMD:   2019 osteopenia TDaP:  2017 Shingles: 2014 Pneumonia: 2016 Hep C and HIV: HIV neg yrs ago per patient Labs: PCP   reports that she has never smoked. She has never used smokeless tobacco. She reports current alcohol use. She reports that she does not use drugs.  Past Medical History:  Diagnosis Date  . Abnormal Pap smear of cervix   . Heart murmur   . Hypertension   . Hypothyroid   . Kidney stones   . Osteoporosis    & osteopenia    Past Surgical History:  Procedure Laterality Date  . APPENDECTOMY    . BREAST BIOPSY Left    benign  . BREAST REDUCTION SURGERY    . CERVIX LESION DESTRUCTION N/A 1970   repeat pap smears all  normal  . GANGLION CYST EXCISION  4/12   left foot & left arm  . REDUCTION MAMMAPLASTY    . small bowel injury     fistula    Current Outpatient Medications  Medication Sig Dispense Refill  . acetaminophen (TYLENOL) 500 MG tablet Take 1,000 mg by mouth every 6 (six) hours as needed. For pain    . aspirin 81 MG chewable tablet Chew 81 mg by mouth daily.    . Blood Pressure Monitoring (OMRON 5 SERIES BP MONITOR) DEVI     . diltiazem (CARDIZEM CD) 180 MG 24 hr capsule Take 180 mg by mouth daily.  4  . Docusate Sodium (COLACE PO) Take 100 mg by mouth daily as needed. constipation    . hydrochlorothiazide (HYDRODIURIL) 25 MG tablet Take 25 mg by mouth every morning.    Marland Kitchen ibuprofen (ADVIL,MOTRIN) 200 MG tablet Take 400 mg by mouth every 6 (six) hours as needed for moderate pain.    Marland Kitchen LORazepam (ATIVAN) 1 MG tablet Take 0.5 mg by mouth every 8 (eight) hours as needed. For anxiety    . MELATONIN PO Take 5 mg by mouth as needed.     . Multiple Vitamin (MULTIVITAMIN WITH MINERALS) TABS tablet Take 1 tablet by mouth as needed.     . Pseudoephedrine HCl (SUDAFED PO) Take by mouth.    . SYNTHROID 125 MCG tablet     . triamcinolone cream (KENALOG) 0.1 % APPLY TO AFFECTED AREA TWICE A DAY     No current facility-administered medications  for this visit.    Family History  Problem Relation Age of Onset  . Breast cancer Mother   . Cancer Brother        leukemia  . Emphysema Father   . Heart attack Father   . Other Daughter        rhematoid arthritis    ROS:  Pertinent items are noted in HPI.  Otherwise, a comprehensive ROS was negative.  Exam:   BP 118/80   Pulse 70   Temp (!) 97.3 F (36.3 C) (Skin)   Resp 16   Ht 4' 11.75" (1.518 m)   Wt 163 lb (73.9 kg)   LMP 02/23/1993   BMI 32.10 kg/m  Height: 4' 11.75" (151.8 cm) Ht Readings from Last 3 Encounters:  05/01/19 4' 11.75" (1.518 m)  10/27/17 4' 11.75" (1.518 m)  10/15/16 4' 11.75" (1.518 m)    General appearance: alert, cooperative and appears stated age Head: Normocephalic, without obvious abnormality, atraumatic Neck: no adenopathy,  supple, symmetrical, trachea midline and thyroid normal to inspection and palpation Lungs: clear to auscultation bilaterally Breasts: normal appearance, no masses or tenderness, No nipple retraction or dimpling, No nipple discharge or bleeding, No axillary or supraclavicular adenopathy, reduction scarring bilateral Heart: regular rate and rhythm Abdomen: soft, non-tender; no masses,  no organomegaly Extremities: extremities normal, atraumatic, no cyanosis or edema Skin: Skin color, texture, turgor normal. No rashes or lesions Lymph nodes: Cervical, supraclavicular, and axillary nodes normal. No abnormal inguinal nodes palpated Neurologic: Grossly normal   Pelvic: External genitalia:  no lesions              Urethra:  normal appearing urethra with no masses, tenderness or lesions              Bartholin's and Skene's: normal                 Vagina: normal appearing vagina with normal color and discharge, no lesions              Cervix: no bleeding following Pap, no cervical motion tenderness, no lesions and normal appearance              Pap taken: Yes.   Bimanual Exam:  Uterus:  normal size, contour, position, consistency, mobility, non-tender and anteverted              Adnexa: normal adnexa and no mass, fullness, tenderness               Rectovaginal: Confirms               Anus:  normal sphincter tone, no lesions  Chaperone present: yes  A:  Well Woman with normal exam  Post menopausal no HRT  BMD due history of Osteopenia  Hypertension/hyperthyroid/anxiety with PCP management  P:   Reviewed health and wellness pertinent to exam  Aware of need to advise if vaginal bleeding  Discussed due this year, will place order, she will call to schedule.  Continue follow up with PCP as indicated  Pap smear: yes   counseled on breast self exam, mammography screening, feminine hygiene, menopause, adequate intake of calcium and vitamin D, diet and exercise, Kegel's exercises  return annually  or prn  An After Visit Summary was printed and given to the patient.

## 2019-05-04 LAB — CYTOLOGY - PAP
Comment: NEGATIVE
Diagnosis: NEGATIVE
High risk HPV: NEGATIVE

## 2019-05-06 DIAGNOSIS — Z23 Encounter for immunization: Secondary | ICD-10-CM | POA: Diagnosis not present

## 2019-05-16 ENCOUNTER — Encounter: Payer: Self-pay | Admitting: Certified Nurse Midwife

## 2019-06-09 DIAGNOSIS — M7581 Other shoulder lesions, right shoulder: Secondary | ICD-10-CM | POA: Diagnosis not present

## 2019-06-09 DIAGNOSIS — M1812 Unilateral primary osteoarthritis of first carpometacarpal joint, left hand: Secondary | ICD-10-CM | POA: Diagnosis not present

## 2019-09-11 DIAGNOSIS — I10 Essential (primary) hypertension: Secondary | ICD-10-CM | POA: Diagnosis not present

## 2019-09-11 DIAGNOSIS — E039 Hypothyroidism, unspecified: Secondary | ICD-10-CM | POA: Diagnosis not present

## 2019-09-11 DIAGNOSIS — J309 Allergic rhinitis, unspecified: Secondary | ICD-10-CM | POA: Diagnosis not present

## 2019-09-11 DIAGNOSIS — Z1389 Encounter for screening for other disorder: Secondary | ICD-10-CM | POA: Diagnosis not present

## 2019-09-11 DIAGNOSIS — E213 Hyperparathyroidism, unspecified: Secondary | ICD-10-CM | POA: Diagnosis not present

## 2019-09-11 DIAGNOSIS — Z23 Encounter for immunization: Secondary | ICD-10-CM | POA: Diagnosis not present

## 2019-09-11 DIAGNOSIS — R7303 Prediabetes: Secondary | ICD-10-CM | POA: Diagnosis not present

## 2019-09-11 DIAGNOSIS — F419 Anxiety disorder, unspecified: Secondary | ICD-10-CM | POA: Diagnosis not present

## 2019-09-11 DIAGNOSIS — F39 Unspecified mood [affective] disorder: Secondary | ICD-10-CM | POA: Diagnosis not present

## 2019-09-11 DIAGNOSIS — E669 Obesity, unspecified: Secondary | ICD-10-CM | POA: Diagnosis not present

## 2019-09-11 DIAGNOSIS — Z0000 Encounter for general adult medical examination without abnormal findings: Secondary | ICD-10-CM | POA: Diagnosis not present

## 2019-11-28 DIAGNOSIS — M25552 Pain in left hip: Secondary | ICD-10-CM | POA: Diagnosis not present

## 2019-11-28 DIAGNOSIS — M5459 Other low back pain: Secondary | ICD-10-CM | POA: Diagnosis not present

## 2019-11-29 DIAGNOSIS — Z23 Encounter for immunization: Secondary | ICD-10-CM | POA: Diagnosis not present

## 2019-12-02 DIAGNOSIS — M545 Low back pain, unspecified: Secondary | ICD-10-CM | POA: Diagnosis not present

## 2019-12-05 DIAGNOSIS — M545 Low back pain, unspecified: Secondary | ICD-10-CM | POA: Diagnosis not present

## 2019-12-13 ENCOUNTER — Other Ambulatory Visit: Payer: Self-pay | Admitting: Family Medicine

## 2019-12-13 DIAGNOSIS — Z1231 Encounter for screening mammogram for malignant neoplasm of breast: Secondary | ICD-10-CM

## 2019-12-15 DIAGNOSIS — Z23 Encounter for immunization: Secondary | ICD-10-CM | POA: Diagnosis not present

## 2020-01-10 ENCOUNTER — Ambulatory Visit: Payer: Medicare Other

## 2020-01-29 ENCOUNTER — Other Ambulatory Visit: Payer: Self-pay

## 2020-01-29 ENCOUNTER — Ambulatory Visit
Admission: RE | Admit: 2020-01-29 | Discharge: 2020-01-29 | Disposition: A | Discharge status: Home / Self Care | Payer: Medicare Other | Source: Ambulatory Visit | Attending: Family Medicine | Admitting: Family Medicine

## 2020-01-29 DIAGNOSIS — Z1231 Encounter for screening mammogram for malignant neoplasm of breast: Secondary | ICD-10-CM | POA: Diagnosis not present

## 2020-03-04 DIAGNOSIS — D2261 Melanocytic nevi of right upper limb, including shoulder: Secondary | ICD-10-CM | POA: Diagnosis not present

## 2020-03-22 DIAGNOSIS — I10 Essential (primary) hypertension: Secondary | ICD-10-CM | POA: Diagnosis not present

## 2020-03-22 DIAGNOSIS — R7303 Prediabetes: Secondary | ICD-10-CM | POA: Diagnosis not present

## 2020-03-22 DIAGNOSIS — E039 Hypothyroidism, unspecified: Secondary | ICD-10-CM | POA: Diagnosis not present

## 2020-04-11 DIAGNOSIS — H52203 Unspecified astigmatism, bilateral: Secondary | ICD-10-CM | POA: Diagnosis not present

## 2020-04-11 DIAGNOSIS — H3500 Unspecified background retinopathy: Secondary | ICD-10-CM | POA: Diagnosis not present

## 2020-04-11 DIAGNOSIS — H5213 Myopia, bilateral: Secondary | ICD-10-CM | POA: Diagnosis not present

## 2020-04-30 NOTE — Progress Notes (Signed)
70 y.o. G45P1001 Married White or Caucasian female here for breast & pelvic.      Reports early menopause around age 81. Took HRT until age 19. Denies vaginal bleeding  Due for Bone density scan this year, will ask PCP about ordering.  Sometimes constipated, but improves when drinks water. Denies urinary problems  Is a retired Therapist, sports.   Patient's last menstrual period was 02/23/1993.          Sexually active: No.  The current method of family planning is post menopausal status & husband vasectomy.    Exercising: Yes.    some walking Smoker:  no  Health Maintenance: Pap:  05-01-2019 neg HPV HR neg History of abnormal Pap:  Yes, yrs ago MMG:  01-29-2020 category c density birads 1:neg Colonoscopy:  2012 f/u 46yrs BMD:   2019 TDaP:  2017 Gardasil:   n/a Covid-19: moderna Hep C testing: not done   reports that she has never smoked. She has never used smokeless tobacco. She reports previous alcohol use. She reports that she does not use drugs.  Past Medical History:  Diagnosis Date  . Abnormal Pap smear of cervix   . Heart murmur   . Hypertension   . Hypothyroid   . Kidney stones   . Osteoporosis    & osteopenia    Past Surgical History:  Procedure Laterality Date  . APPENDECTOMY    . BREAST BIOPSY Left    benign  . BREAST REDUCTION SURGERY    . CERVIX LESION DESTRUCTION N/A 1970   repeat pap smears all  normal  . GANGLION CYST EXCISION  4/12   left foot & left arm  . REDUCTION MAMMAPLASTY    . small bowel injury     fistula    Current Outpatient Medications  Medication Sig Dispense Refill  . acetaminophen (TYLENOL) 500 MG tablet Take 1,000 mg by mouth every 6 (six) hours as needed. For pain    . aspirin 81 MG chewable tablet Chew 81 mg by mouth daily.    Marland Kitchen diltiazem (CARDIZEM CD) 180 MG 24 hr capsule Take 180 mg by mouth daily.  4  . Docusate Sodium (COLACE PO) Take 100 mg by mouth daily as needed. constipation    . hydrochlorothiazide (HYDRODIURIL) 25 MG tablet Take  25 mg by mouth every morning.    Marland Kitchen ibuprofen (ADVIL,MOTRIN) 200 MG tablet Take 400 mg by mouth every 6 (six) hours as needed for moderate pain.    Marland Kitchen LORazepam (ATIVAN) 1 MG tablet Take 0.5 mg by mouth every 8 (eight) hours as needed. For anxiety    . MELATONIN PO Take 5 mg by mouth as needed.     . Multiple Vitamin (MULTIVITAMIN WITH MINERALS) TABS tablet Take 1 tablet by mouth as needed.     Marland Kitchen SYNTHROID 125 MCG tablet      No current facility-administered medications for this visit.    Family History  Problem Relation Age of Onset  . Breast cancer Mother   . Cancer Brother        leukemia  . Emphysema Father   . Heart attack Father   . Other Daughter        rhematoid arthritis    Review of Systems  Constitutional: Negative.   HENT: Negative.   Eyes: Negative.   Respiratory: Negative.   Cardiovascular: Negative.   Gastrointestinal: Negative.   Endocrine: Negative.   Genitourinary: Negative.   Musculoskeletal: Negative.   Skin: Negative.   Allergic/Immunologic: Negative.  Neurological: Negative.   Hematological: Negative.   Psychiatric/Behavioral: Negative.     Exam:   BP 110/68   Pulse 68   Resp 16   Ht 5' (1.524 m)   Wt 163 lb (73.9 kg)   LMP 02/23/1993   BMI 31.83 kg/m   Height: 5' (152.4 cm)  General appearance: alert, cooperative and appears stated age, no acute distress Head: Normocephalic, without obvious abnormality Neck: no adenopathy, thyroid normal to inspection and palpation Lungs: clear to auscultation bilaterally Breasts: No axillary or supraclavicular adenopathy, Normal to palpation without dominant masses Heart: regular rate and rhythm Abdomen: soft, non-tender; no masses,  no organomegaly Extremities: extremities normal, no edema Skin: No rashes or lesions Lymph nodes: Cervical, supraclavicular, and axillary nodes normal. No abnormal inguinal nodes palpated Neurologic: Grossly normal   Pelvic: External genitalia:  no lesions               Urethra:  normal appearing urethra with no masses, tenderness or lesions              Bartholins and Skenes: normal                 Vagina: normal appearing vagina, appropriate for age, normal appearing discharge,  Soft pedunculated structure palpable in anterior vagina, non tender, no redness, no bleeding, approximately 1-1.5 cm in size. Not initially seen with speculum exam (as looks to be normal vaginal mucosa)              Cervix: neg cervical motion tenderness, no visible lesions             Bimanual Exam:   Uterus:  normal size, contour, position, consistency, mobility, non-tender              Adnexa: no mass, fullness, tenderness                 A:  Well Woman with normal exam  Vaginal structure, pedunculated  P:   Pap :cotesting due 2026  Mammogram: due next 12/22  Labs:with PCP  Medications: no new  Dexa: due this year, will discuss with PCP  **Recheck vaginal growth noted in anterior vagina, if growing, changing or symptomatic will recommend removal

## 2020-05-02 ENCOUNTER — Encounter: Payer: Self-pay | Admitting: Nurse Practitioner

## 2020-05-02 ENCOUNTER — Other Ambulatory Visit: Payer: Self-pay

## 2020-05-02 ENCOUNTER — Ambulatory Visit (INDEPENDENT_AMBULATORY_CARE_PROVIDER_SITE_OTHER): Payer: Medicare Other | Admitting: Nurse Practitioner

## 2020-05-02 VITALS — BP 110/68 | HR 68 | Resp 16 | Ht 60.0 in | Wt 163.0 lb

## 2020-05-02 DIAGNOSIS — Z01419 Encounter for gynecological examination (general) (routine) without abnormal findings: Secondary | ICD-10-CM

## 2020-05-02 DIAGNOSIS — N898 Other specified noninflammatory disorders of vagina: Secondary | ICD-10-CM

## 2020-05-02 NOTE — Patient Instructions (Signed)
Health Maintenance After Age 72 After age 30, you are at a higher risk for certain long-term diseases and infections as well as injuries from falls. Falls are a major cause of broken bones and head injuries in people who are older than age 64. Getting regular preventive care can help to keep you healthy and well. Preventive care includes getting regular testing and making lifestyle changes as recommended by your health care provider. Talk with your health care provider about:  Which screenings and tests you should have. A screening is a test that checks for a disease when you have no symptoms.  A diet and exercise plan that is right for you. What should I know about screenings and tests to prevent falls? Screening and testing are the best ways to find a health problem early. Early diagnosis and treatment give you the best chance of managing medical conditions that are common after age 71. Certain conditions and lifestyle choices may make you more likely to have a fall. Your health care provider may recommend:  Regular vision checks. Poor vision and conditions such as cataracts can make you more likely to have a fall. If you wear glasses, make sure to get your prescription updated if your vision changes.  Medicine review. Work with your health care provider to regularly review all of the medicines you are taking, including over-the-counter medicines. Ask your health care provider about any side effects that may make you more likely to have a fall. Tell your health care provider if any medicines that you take make you feel dizzy or sleepy.  Osteoporosis screening. Osteoporosis is a condition that causes the bones to get weaker. This can make the bones weak and cause them to break more easily.  Blood pressure screening. Blood pressure changes and medicines to control blood pressure can make you feel dizzy.  Strength and balance checks. Your health care provider may recommend certain tests to check  your strength and balance while standing, walking, or changing positions.  Foot health exam. Foot pain and numbness, as well as not wearing proper footwear, can make you more likely to have a fall.  Depression screening. You may be more likely to have a fall if you have a fear of falling, feel emotionally low, or feel unable to do activities that you used to do.  Alcohol use screening. Using too much alcohol can affect your balance and may make you more likely to have a fall. What actions can I take to lower my risk of falls? General instructions  Talk with your health care provider about your risks for falling. Tell your health care provider if: ? You fall. Be sure to tell your health care provider about all falls, even ones that seem minor. ? You feel dizzy, sleepy, or off-balance.  Take over-the-counter and prescription medicines only as told by your health care provider. These include any supplements.  Eat a healthy diet and maintain a healthy weight. A healthy diet includes low-fat dairy products, low-fat (lean) meats, and fiber from whole grains, beans, and lots of fruits and vegetables. Home safety  Remove any tripping hazards, such as rugs, cords, and clutter.  Install safety equipment such as grab bars in bathrooms and safety rails on stairs.  Keep rooms and walkways well-lit. Activity  Follow a regular exercise program to stay fit. This will help you maintain your balance. Ask your health care provider what types of exercise are appropriate for you.  If you need a cane or walker,  use it as recommended by your health care provider.  Wear supportive shoes that have nonskid soles.   Lifestyle  Do not drink alcohol if your health care provider tells you not to drink.  If you drink alcohol, limit how much you have: ? 0-1 drink a day for women. ? 0-2 drinks a day for men.  Be aware of how much alcohol is in your drink. In the [place] one drink equals one typical bottle of  beer (12 oz), one-half glass of wine (5 oz), or one shot of hard liquor (1 oz).  Do not use any products that contain nicotine or tobacco, such as cigarettes and e-cigarettes. If you need help quitting, ask your health care provider. Summary  Having a healthy lifestyle and getting preventive care can help to protect your health and wellness after age 60.  Screening and testing are the best way to find a health problem early and help you avoid having a fall. Early diagnosis and treatment give you the best chance for managing medical conditions that are more common for people who are older than age 1.  Falls are a major cause of broken bones and head injuries in people who are older than age 43. Take precautions to prevent a fall at home.  Work with your health care provider to learn what changes you can make to improve your health and wellness and to prevent falls. This information is not intended to replace advice given to you by your health care provider. Make sure you discuss any questions you have with your health care provider. Document Revised: 06/02/2018 Document Reviewed: 12/23/2016 Elsevier Patient Education  2021 [place]. Pap :cotesting due 2026  Mammogram: due next 12/22  Labs:with PCP  Medications: no new  Dexa: due this year, will discuss with PCP

## 2020-07-03 DIAGNOSIS — Z23 Encounter for immunization: Secondary | ICD-10-CM | POA: Diagnosis not present

## 2020-08-05 DIAGNOSIS — M79606 Pain in leg, unspecified: Secondary | ICD-10-CM | POA: Diagnosis not present

## 2020-08-05 DIAGNOSIS — R209 Unspecified disturbances of skin sensation: Secondary | ICD-10-CM | POA: Diagnosis not present

## 2020-08-22 ENCOUNTER — Ambulatory Visit: Payer: Federal, State, Local not specified - PPO | Admitting: Nurse Practitioner

## 2020-08-28 ENCOUNTER — Ambulatory Visit (INDEPENDENT_AMBULATORY_CARE_PROVIDER_SITE_OTHER): Payer: Medicare Other | Admitting: Obstetrics & Gynecology

## 2020-08-28 ENCOUNTER — Encounter: Payer: Self-pay | Admitting: Obstetrics & Gynecology

## 2020-08-28 ENCOUNTER — Other Ambulatory Visit (HOSPITAL_COMMUNITY)
Admission: RE | Admit: 2020-08-28 | Discharge: 2020-08-28 | Disposition: A | Discharge status: Home / Self Care | Payer: Medicare Other | Source: Ambulatory Visit | Attending: Obstetrics & Gynecology | Admitting: Obstetrics & Gynecology

## 2020-08-28 ENCOUNTER — Other Ambulatory Visit: Payer: Self-pay

## 2020-08-28 VITALS — BP 118/70 | HR 52 | Resp 16

## 2020-08-28 DIAGNOSIS — N842 Polyp of vagina: Secondary | ICD-10-CM

## 2020-08-28 NOTE — Progress Notes (Signed)
    Bridget [name] 04/02/1950 [id number]        70 y.o.  G1P1001 Married  RP: F/U Vaginal Cyst  HPI: Vaginal cyst on Gynecologic exam with Karma Ganja NP in 04/2020.  Patient is completely asymptomatic.  Abstinent.   OB History  Gravida Para Term Preterm AB Living  1 1 1     1   SAB IAB Ectopic Multiple Live Births          1    # Outcome Date GA Lbr Len/2nd Weight Sex Delivery Anes PTL Lv  1 Term     F Vag-Spont   LIV    Past medical history,surgical history, problem list, medications, allergies, family history and social history were all reviewed and documented in the EPIC chart.   Directed ROS with pertinent positives and negatives documented in the history of present illness/assessment and plan.  Exam:  Vitals:   08/28/20 1156  BP: 118/70  Pulse: (!) 52  Resp: 16   General appearance:  Normal  Abdomen: Normal  Gynecologic exam: Vulva normal.  Speculum:  Cervix normal.  Rt mid vaginal Polyp about 1 x 1.5 cm.    Written informed consent obtained.  Betadine prep.  Local anesthesia with Lidocaine 1%.  Excision of Polyp with scissors at the base.  Silver Nitrate for hemostasis.  Specimen sent to pathology.   Assessment/Plan:  70 y.o. G1P1001   1. Polyp of vagina Asymptomatic polyp of the vagina.  Excision of vaginal polyp without complication.  Well tolerated.  Post procedure precautions reviewed.  Patho pending. - Surgical pathology( [place]/ [facility])   Princess Bruins MD, 12:46 PM 08/28/2020

## 2020-08-29 ENCOUNTER — Ambulatory Visit: Payer: Federal, State, Local not specified - PPO | Admitting: Obstetrics & Gynecology

## 2020-08-30 LAB — SURGICAL PATHOLOGY

## 2020-09-11 DIAGNOSIS — R7303 Prediabetes: Secondary | ICD-10-CM | POA: Diagnosis not present

## 2020-09-11 DIAGNOSIS — F419 Anxiety disorder, unspecified: Secondary | ICD-10-CM | POA: Diagnosis not present

## 2020-09-11 DIAGNOSIS — I10 Essential (primary) hypertension: Secondary | ICD-10-CM | POA: Diagnosis not present

## 2020-09-11 DIAGNOSIS — J309 Allergic rhinitis, unspecified: Secondary | ICD-10-CM | POA: Diagnosis not present

## 2020-09-11 DIAGNOSIS — E213 Hyperparathyroidism, unspecified: Secondary | ICD-10-CM | POA: Diagnosis not present

## 2020-09-11 DIAGNOSIS — E039 Hypothyroidism, unspecified: Secondary | ICD-10-CM | POA: Diagnosis not present

## 2020-09-11 DIAGNOSIS — F39 Unspecified mood [affective] disorder: Secondary | ICD-10-CM | POA: Diagnosis not present

## 2020-09-11 DIAGNOSIS — M8580 Other specified disorders of bone density and structure, unspecified site: Secondary | ICD-10-CM | POA: Diagnosis not present

## 2020-09-11 DIAGNOSIS — Z0000 Encounter for general adult medical examination without abnormal findings: Secondary | ICD-10-CM | POA: Diagnosis not present

## 2020-09-11 DIAGNOSIS — Z1389 Encounter for screening for other disorder: Secondary | ICD-10-CM | POA: Diagnosis not present

## 2020-09-13 ENCOUNTER — Other Ambulatory Visit: Payer: Self-pay | Admitting: Family Medicine

## 2020-09-13 DIAGNOSIS — M8580 Other specified disorders of bone density and structure, unspecified site: Secondary | ICD-10-CM

## 2020-09-13 DIAGNOSIS — E2839 Other primary ovarian failure: Secondary | ICD-10-CM

## 2020-11-26 DIAGNOSIS — Z23 Encounter for immunization: Secondary | ICD-10-CM | POA: Diagnosis not present

## 2020-12-19 DIAGNOSIS — M7062 Trochanteric bursitis, left hip: Secondary | ICD-10-CM | POA: Diagnosis not present

## 2020-12-19 DIAGNOSIS — D1724 Benign lipomatous neoplasm of skin and subcutaneous tissue of left leg: Secondary | ICD-10-CM | POA: Diagnosis not present

## 2020-12-19 DIAGNOSIS — M5442 Lumbago with sciatica, left side: Secondary | ICD-10-CM | POA: Diagnosis not present

## 2020-12-19 DIAGNOSIS — G8929 Other chronic pain: Secondary | ICD-10-CM | POA: Diagnosis not present

## 2020-12-25 ENCOUNTER — Other Ambulatory Visit: Payer: Self-pay | Admitting: Family Medicine

## 2020-12-25 DIAGNOSIS — Z1231 Encounter for screening mammogram for malignant neoplasm of breast: Secondary | ICD-10-CM

## 2021-03-04 DIAGNOSIS — I10 Essential (primary) hypertension: Secondary | ICD-10-CM | POA: Diagnosis not present

## 2021-03-04 DIAGNOSIS — R059 Cough, unspecified: Secondary | ICD-10-CM | POA: Diagnosis not present

## 2021-03-04 DIAGNOSIS — R7303 Prediabetes: Secondary | ICD-10-CM | POA: Diagnosis not present

## 2021-03-07 ENCOUNTER — Ambulatory Visit
Admission: RE | Admit: 2021-03-07 | Discharge: 2021-03-07 | Disposition: A | Discharge status: Home / Self Care | Payer: Medicare Other | Source: Ambulatory Visit | Attending: Family Medicine | Admitting: Family Medicine

## 2021-03-07 DIAGNOSIS — Z1231 Encounter for screening mammogram for malignant neoplasm of breast: Secondary | ICD-10-CM | POA: Diagnosis not present

## 2021-03-07 DIAGNOSIS — Z780 Asymptomatic menopausal state: Secondary | ICD-10-CM | POA: Diagnosis not present

## 2021-03-07 DIAGNOSIS — E2839 Other primary ovarian failure: Secondary | ICD-10-CM

## 2021-03-07 DIAGNOSIS — M8589 Other specified disorders of bone density and structure, multiple sites: Secondary | ICD-10-CM | POA: Diagnosis not present

## 2021-03-07 DIAGNOSIS — M8580 Other specified disorders of bone density and structure, unspecified site: Secondary | ICD-10-CM

## 2021-03-21 DIAGNOSIS — J019 Acute sinusitis, unspecified: Secondary | ICD-10-CM | POA: Diagnosis not present

## 2021-03-21 DIAGNOSIS — E039 Hypothyroidism, unspecified: Secondary | ICD-10-CM | POA: Diagnosis not present

## 2021-03-21 DIAGNOSIS — I10 Essential (primary) hypertension: Secondary | ICD-10-CM | POA: Diagnosis not present

## 2021-04-14 DIAGNOSIS — H2513 Age-related nuclear cataract, bilateral: Secondary | ICD-10-CM | POA: Diagnosis not present

## 2021-04-14 DIAGNOSIS — H52203 Unspecified astigmatism, bilateral: Secondary | ICD-10-CM | POA: Diagnosis not present

## 2021-04-14 DIAGNOSIS — E119 Type 2 diabetes mellitus without complications: Secondary | ICD-10-CM | POA: Diagnosis not present

## 2021-04-14 DIAGNOSIS — H5213 Myopia, bilateral: Secondary | ICD-10-CM | POA: Diagnosis not present

## 2021-05-05 DIAGNOSIS — D2261 Melanocytic nevi of right upper limb, including shoulder: Secondary | ICD-10-CM | POA: Diagnosis not present

## 2021-05-05 DIAGNOSIS — D225 Melanocytic nevi of trunk: Secondary | ICD-10-CM | POA: Diagnosis not present

## 2021-05-05 DIAGNOSIS — D2272 Melanocytic nevi of left lower limb, including hip: Secondary | ICD-10-CM | POA: Diagnosis not present

## 2021-05-05 DIAGNOSIS — D1801 Hemangioma of skin and subcutaneous tissue: Secondary | ICD-10-CM | POA: Diagnosis not present

## 2021-05-05 DIAGNOSIS — L821 Other seborrheic keratosis: Secondary | ICD-10-CM | POA: Diagnosis not present

## 2021-05-05 DIAGNOSIS — D2262 Melanocytic nevi of left upper limb, including shoulder: Secondary | ICD-10-CM | POA: Diagnosis not present

## 2021-05-06 ENCOUNTER — Ambulatory Visit (INDEPENDENT_AMBULATORY_CARE_PROVIDER_SITE_OTHER): Payer: Federal, State, Local not specified - PPO | Admitting: Obstetrics & Gynecology

## 2021-05-06 ENCOUNTER — Encounter: Payer: Self-pay | Admitting: Obstetrics & Gynecology

## 2021-05-06 ENCOUNTER — Other Ambulatory Visit: Payer: Self-pay

## 2021-05-06 VITALS — BP 116/76 | HR 60 | Resp 16 | Ht 59.5 in | Wt 162.0 lb

## 2021-05-06 DIAGNOSIS — Z780 Asymptomatic menopausal state: Secondary | ICD-10-CM

## 2021-05-06 DIAGNOSIS — Z01419 Encounter for gynecological examination (general) (routine) without abnormal findings: Secondary | ICD-10-CM

## 2021-05-06 DIAGNOSIS — Z6832 Body mass index (BMI) 32.0-32.9, adult: Secondary | ICD-10-CM

## 2021-05-06 DIAGNOSIS — M8589 Other specified disorders of bone density and structure, multiple sites: Secondary | ICD-10-CM

## 2021-05-06 DIAGNOSIS — E6609 Other obesity due to excess calories: Secondary | ICD-10-CM

## 2021-05-06 NOTE — Progress Notes (Signed)
? ? ?Bridget [name] [birth year] [id number] ? ? ?History:    71 y.o. G1P1L1 ? ?RP:  Established patient presenting for annual gyn exam  ? ?HPI: Postmenopausal, well on no HRT.  No PMB.  No pelvic pain.  Abstinent.  Pap Neg 04/2019.  No h/o abnormal Pap.  Vaginal Polyp benign, excised 08/2020. Breasts normal.  Mammo Neg 02/2021.  BD 02/2021 Osteopenia T-Score -1.7.  Walking her dog.  BMI 32.17.  Health labs with Fam MD.  Last Colono 2012, will schedule Colono or ColoGuard through her Fam MD. ? ? ?Past medical history,surgical history, family history and social history were all reviewed and documented in the EPIC chart. ? ?Gynecologic History ?Patient's last menstrual period was 02/23/1993. ? ?Obstetric History ?OB History  ?Gravida Para Term Preterm AB Living  ?'1 1 1     1  '$ ?SAB IAB Ectopic Multiple Live Births  ?        1  ?  ?# Outcome Date GA Lbr Len/2nd Weight Sex Delivery Anes PTL Lv  ?1 Term     F Vag-Spont   LIV  ? ? ? ?ROS: A ROS was performed and pertinent positives and negatives are included in the history. ? GENERAL: No fevers or chills. HEENT: No change in vision, no earache, sore throat or sinus congestion. NECK: No pain or stiffness. CARDIOVASCULAR: No chest pain or pressure. No palpitations. PULMONARY: No shortness of breath, cough or wheeze. GASTROINTESTINAL: No abdominal pain, nausea, vomiting or diarrhea, melena or bright red blood per rectum. GENITOURINARY: No urinary frequency, urgency, hesitancy or dysuria. MUSCULOSKELETAL: No joint or muscle pain, no back pain, no recent trauma. DERMATOLOGIC: No rash, no itching, no lesions. ENDOCRINE: No polyuria, polydipsia, no heat or cold intolerance. No recent change in weight. HEMATOLOGICAL: No anemia or easy bruising or bleeding. NEUROLOGIC: No headache, seizures, numbness, tingling or weakness. PSYCHIATRIC: No depression, no loss of interest in normal activity or change in sleep pattern.  ?  ? ?Exam: ? ? ?BP 116/76   Pulse 60   Resp 16   Ht 4' 11.5" (1.511  m)   Wt 162 lb (73.5 kg)   LMP 02/23/1993   BMI 32.17 kg/m?  ? ?Body mass index is 32.17 kg/m?. ? ?General appearance : Well developed well nourished female. No acute distress ?HEENT: Eyes: no retinal hemorrhage or exudates,  Neck supple, trachea midline, no carotid bruits, no thyroidmegaly ?Lungs: Clear to auscultation, no rhonchi or wheezes, or rib retractions  ?Heart: Regular rate and rhythm, no murmurs or gallops ?Breast:Examined in sitting and supine position were symmetrical in appearance, no palpable masses or tenderness,  no skin retraction, no nipple inversion, no nipple discharge, no skin discoloration, no axillary or supraclavicular lymphadenopathy ?Abdomen: no palpable masses or tenderness, no rebound or guarding ?Extremities: no edema or skin discoloration or tenderness ? ?Pelvic: Vulva: Normal ?            Vagina: No gross lesions or discharge ? Cervix: No gross lesions or discharge ? Uterus  AV, normal size, shape and consistency, non-tender and mobile ? Adnexa  Without masses or tenderness ? Anus: Normal ? ? ?Assessment/Plan:  71 y.o. female for annual exam  ? ?1. Well female exam with routine gynecological exam ?Postmenopausal, well on no HRT.  No PMB.  No pelvic pain.  Abstinent.  Pap Neg 04/2019.  No h/o abnormal Pap.  Vaginal Polyp benign, excised 08/2020. Breasts normal.  Mammo Neg 02/2021.  BD 02/2021 Osteopenia T-Score -1.7.  Walking her  dog.  BMI 32.17.  Health labs with Fam MD.  Last Colono 2012, will schedule Colono or ColoGuard through her Fam MD. ? ?2. Postmenopause ?Postmenopausal, well on no HRT.  No PMB.  No pelvic pain.  Abstinent.   ? ?3. Osteopenia of multiple sites ?BD 02/2021 Osteopenia T-Score -1.7.  Walking her dog.  Ca++ 1.5 g/d total, Vit D supplement, regular weight bearing physical acitivites. ? ?4. Class 1 obesity due to excess calories with serious comorbidity and body mass index (BMI) of 32.0 to 32.9 in adult ?Lower calorie/carb diet.  Increase fitness activities. ? ?Other  orders ?- ASPIRIN 81 PO; Take by mouth.  ? ?Princess Bruins MD, 1:53 PM 05/06/2021 ? ?  ?

## 2021-07-17 ENCOUNTER — Emergency Department (HOSPITAL_COMMUNITY): Payer: Medicare Other

## 2021-07-17 ENCOUNTER — Encounter (HOSPITAL_COMMUNITY): Payer: Self-pay

## 2021-07-17 ENCOUNTER — Other Ambulatory Visit: Payer: Self-pay

## 2021-07-17 ENCOUNTER — Inpatient Hospital Stay (HOSPITAL_COMMUNITY)
Admission: EM | Admit: 2021-07-17 | Discharge: 2021-07-20 | DRG: 418 | Disposition: A | Discharge status: Home / Self Care | Payer: Medicare Other | Attending: Family Medicine | Admitting: Family Medicine

## 2021-07-17 DIAGNOSIS — Z79899 Other long term (current) drug therapy: Secondary | ICD-10-CM | POA: Diagnosis not present

## 2021-07-17 DIAGNOSIS — R9431 Abnormal electrocardiogram [ECG] [EKG]: Secondary | ICD-10-CM

## 2021-07-17 DIAGNOSIS — I447 Left bundle-branch block, unspecified: Secondary | ICD-10-CM | POA: Diagnosis present

## 2021-07-17 DIAGNOSIS — E039 Hypothyroidism, unspecified: Secondary | ICD-10-CM | POA: Diagnosis present

## 2021-07-17 DIAGNOSIS — I774 Celiac artery compression syndrome: Secondary | ICD-10-CM | POA: Diagnosis not present

## 2021-07-17 DIAGNOSIS — M47814 Spondylosis without myelopathy or radiculopathy, thoracic region: Secondary | ICD-10-CM | POA: Diagnosis not present

## 2021-07-17 DIAGNOSIS — R110 Nausea: Secondary | ICD-10-CM | POA: Diagnosis not present

## 2021-07-17 DIAGNOSIS — M810 Age-related osteoporosis without current pathological fracture: Secondary | ICD-10-CM | POA: Diagnosis present

## 2021-07-17 DIAGNOSIS — R109 Unspecified abdominal pain: Secondary | ICD-10-CM | POA: Diagnosis not present

## 2021-07-17 DIAGNOSIS — Z79890 Hormone replacement therapy (postmenopausal): Secondary | ICD-10-CM | POA: Diagnosis not present

## 2021-07-17 DIAGNOSIS — R001 Bradycardia, unspecified: Secondary | ICD-10-CM | POA: Diagnosis not present

## 2021-07-17 DIAGNOSIS — I169 Hypertensive crisis, unspecified: Principal | ICD-10-CM

## 2021-07-17 DIAGNOSIS — R079 Chest pain, unspecified: Secondary | ICD-10-CM | POA: Diagnosis not present

## 2021-07-17 DIAGNOSIS — J8410 Pulmonary fibrosis, unspecified: Secondary | ICD-10-CM | POA: Diagnosis not present

## 2021-07-17 DIAGNOSIS — J984 Other disorders of lung: Secondary | ICD-10-CM | POA: Diagnosis not present

## 2021-07-17 DIAGNOSIS — K429 Umbilical hernia without obstruction or gangrene: Secondary | ICD-10-CM | POA: Diagnosis not present

## 2021-07-17 DIAGNOSIS — Z7982 Long term (current) use of aspirin: Secondary | ICD-10-CM | POA: Diagnosis not present

## 2021-07-17 DIAGNOSIS — K8000 Calculus of gallbladder with acute cholecystitis without obstruction: Secondary | ICD-10-CM | POA: Diagnosis present

## 2021-07-17 DIAGNOSIS — K8010 Calculus of gallbladder with chronic cholecystitis without obstruction: Secondary | ICD-10-CM | POA: Diagnosis not present

## 2021-07-17 DIAGNOSIS — I161 Hypertensive emergency: Secondary | ICD-10-CM | POA: Diagnosis present

## 2021-07-17 DIAGNOSIS — Z885 Allergy status to narcotic agent status: Secondary | ICD-10-CM | POA: Diagnosis not present

## 2021-07-17 DIAGNOSIS — I10 Essential (primary) hypertension: Secondary | ICD-10-CM | POA: Diagnosis not present

## 2021-07-17 DIAGNOSIS — R1110 Vomiting, unspecified: Secondary | ICD-10-CM | POA: Diagnosis not present

## 2021-07-17 DIAGNOSIS — Z888 Allergy status to other drugs, medicaments and biological substances status: Secondary | ICD-10-CM | POA: Diagnosis not present

## 2021-07-17 DIAGNOSIS — K8020 Calculus of gallbladder without cholecystitis without obstruction: Secondary | ICD-10-CM | POA: Diagnosis not present

## 2021-07-17 DIAGNOSIS — R112 Nausea with vomiting, unspecified: Secondary | ICD-10-CM | POA: Diagnosis not present

## 2021-07-17 DIAGNOSIS — G932 Benign intracranial hypertension: Secondary | ICD-10-CM | POA: Diagnosis not present

## 2021-07-17 DIAGNOSIS — Z8719 Personal history of other diseases of the digestive system: Secondary | ICD-10-CM | POA: Diagnosis not present

## 2021-07-17 DIAGNOSIS — R1013 Epigastric pain: Secondary | ICD-10-CM | POA: Diagnosis not present

## 2021-07-17 DIAGNOSIS — R0789 Other chest pain: Secondary | ICD-10-CM | POA: Diagnosis not present

## 2021-07-17 DIAGNOSIS — I2510 Atherosclerotic heart disease of native coronary artery without angina pectoris: Secondary | ICD-10-CM | POA: Diagnosis not present

## 2021-07-17 LAB — BASIC METABOLIC PANEL
Anion gap: 10 (ref 5–15)
BUN: 24 mg/dL — ABNORMAL HIGH (ref 8–23)
CO2: 23 mmol/L (ref 22–32)
Calcium: 9.6 mg/dL (ref 8.9–10.3)
Chloride: 107 mmol/L (ref 98–111)
Creatinine, Ser: 1.21 mg/dL — ABNORMAL HIGH (ref 0.44–1.00)
GFR, Estimated: 48 mL/min — ABNORMAL LOW (ref >60)
Glucose, Bld: 149 mg/dL — ABNORMAL HIGH (ref 70–99)
Potassium: 3.4 mmol/L — ABNORMAL LOW (ref 3.5–5.1)
Sodium: 140 mmol/L (ref 135–145)

## 2021-07-17 LAB — CBC WITH DIFFERENTIAL/PLATELET
Abs Immature Granulocytes: 0.06 10*3/uL (ref 0.00–0.07)
Basophils Absolute: 0 10*3/uL (ref 0.0–0.1)
Basophils Relative: 0 %
Eosinophils Absolute: 0 10*3/uL (ref 0.0–0.5)
Eosinophils Relative: 0 %
HCT: 37.4 % (ref 36.0–46.0)
Hemoglobin: 12.5 g/dL (ref 12.0–15.0)
Immature Granulocytes: 1 %
Lymphocytes Relative: 19 %
Lymphs Abs: 2.5 10*3/uL (ref 0.7–4.0)
MCH: 27.1 pg (ref 26.0–34.0)
MCHC: 33.4 g/dL (ref 30.0–36.0)
MCV: 81.1 fL (ref 80.0–100.0)
Monocytes Absolute: 0.8 10*3/uL (ref 0.1–1.0)
Monocytes Relative: 6 %
Neutro Abs: 9.6 10*3/uL — ABNORMAL HIGH (ref 1.7–7.7)
Neutrophils Relative %: 74 %
Platelets: 294 10*3/uL (ref 150–400)
RBC: 4.61 MIL/uL (ref 3.87–5.11)
RDW: 13.5 % (ref 11.5–15.5)
WBC: 13 10*3/uL — ABNORMAL HIGH (ref 4.0–10.5)
nRBC: 0 % (ref 0.0–0.2)

## 2021-07-17 LAB — TROPONIN I (HIGH SENSITIVITY): Troponin I (High Sensitivity): 13 ng/L (ref <18)

## 2021-07-17 LAB — I-STAT CREATININE, ED: Creatinine, Ser: 1.3 mg/dL — ABNORMAL HIGH (ref 0.44–1.00)

## 2021-07-17 MED ORDER — MORPHINE SULFATE (PF) 4 MG/ML IV SOLN
4.0000 mg | Freq: Once | INTRAVENOUS | Status: AC
  Administered 2021-07-17: 4 mg via INTRAVENOUS
  Filled 2021-07-17: qty 1

## 2021-07-17 MED ORDER — NITROGLYCERIN IN D5W 200-5 MCG/ML-% IV SOLN: 0.0000 ug/min | INTRAVENOUS | Status: DC

## 2021-07-17 MED ORDER — NICARDIPINE HCL IN NACL 20-0.86 MG/200ML-% IV SOLN
5.0000 mg/h | Freq: Once | INTRAVENOUS | Status: AC
Start: 2021-07-17 — End: 2021-07-18
  Administered 2021-07-18: 5 mg/h via INTRAVENOUS
  Filled 2021-07-17: qty 200

## 2021-07-17 MED ORDER — NITROGLYCERIN 0.4 MG SL SUBL
0.4000 mg | SUBLINGUAL_TABLET | SUBLINGUAL | Status: DC | PRN
  Administered 2021-07-17 (×2): 0.4 mg via SUBLINGUAL
  Filled 2021-07-17: qty 1

## 2021-07-17 MED ORDER — HYDROMORPHONE HCL 1 MG/ML IJ SOLN
1.0000 mg | Freq: Once | INTRAMUSCULAR | Status: AC
  Administered 2021-07-17: 1 mg via INTRAVENOUS
  Filled 2021-07-17: qty 1

## 2021-07-17 MED ORDER — IOHEXOL 350 MG/ML SOLN
100.0000 mL | Freq: Once | INTRAVENOUS | Status: AC | PRN
  Administered 2021-07-17: 100 mL via INTRAVENOUS

## 2021-07-17 MED ORDER — SODIUM BICARBONATE 8.4 % IV SOLN
50.0000 meq | Freq: Once | INTRAVENOUS | Status: AC
  Administered 2021-07-18: 50 meq via INTRAVENOUS
  Filled 2021-07-17: qty 50

## 2021-07-17 MED ORDER — ONDANSETRON HCL 4 MG/2ML IJ SOLN
4.0000 mg | Freq: Once | INTRAMUSCULAR | Status: AC
  Administered 2021-07-17: 4 mg via INTRAVENOUS
  Filled 2021-07-17: qty 2

## 2021-07-17 NOTE — ED Triage Notes (Signed)
Pt BIB [facility] c/o cp starting at 7pm. Pt reports taking '324mg'$  aspirin and 1g tylenol prior to EMS arrival. EMS initial BP 240/82, decreased to 195/73 with 0.'4mg'$  nitro. EMS gave '4mg'$  zofran with no nausea relief. Pt reports nitro made CP worse. Pt reports 9/10 CP at this time.

## 2021-07-17 NOTE — ED Notes (Signed)
Per provider hold all medication at this time. And to use alcohol pads for the pt to help with nausea

## 2021-07-17 NOTE — ED Notes (Signed)
Received verbal report from Paden B RN at this time 

## 2021-07-17 NOTE — ED Provider Notes (Signed)
[facility] EMERGENCY DEPARTMENT Provider Note   CSN: [id number] Arrival date & time: 07/17/21  2206     History  Chief Complaint  Patient presents with   Hypertension    240/82    Bridget [name] is a 71 y.o. female.  HPI 71 year old female with a history of hypertension and hypothyroidism presents with chest pain and elevated blood pressure.  She is on multiple blood pressure meds at home which she has been compliant with.  Starting around 7 PM she felt like she had indigestion and took Tums and then Tylenol.  Finally she checked her blood pressure and it was over 423 systolic.  Became concerned and called EMS.  EMS found an initial blood pressure of 240/82.  They gave 1 nitroglycerin and patient had already taken 4 baby aspirin.  Chest pressure has worsened and is about a 9 out of 10.  It is in the center of her chest but also feels like its a vice grip around her chest to her back.  She feels like she cannot get a full breath.  She vomited once when she got here.  No numbness anywhere or abdominal pain.  Home Medications Prior to Admission medications   Medication Sig Start Date End Date Taking? Authorizing Provider  acetaminophen (TYLENOL) 500 MG tablet Take 1,000 mg by mouth every 6 (six) hours as needed. For pain    [provider]  ASPIRIN 81 PO Take by mouth.    [provider]  diltiazem (CARDIZEM CD) 180 MG 24 hr capsule Take 180 mg by mouth daily. 09/10/14   [provider]  Docusate Sodium (COLACE PO) Take 100 mg by mouth daily as needed. constipation    [provider]  hydrochlorothiazide (HYDRODIURIL) 25 MG tablet Take 25 mg by mouth every morning. 03/13/19   [provider]  ibuprofen (ADVIL,MOTRIN) 200 MG tablet Take 400 mg by mouth every 6 (six) hours as needed for moderate pain.    [provider]  LORazepam (ATIVAN) 1 MG tablet Take 0.5 mg by mouth every 8 (eight) hours as needed. For anxiety     [provider]  MELATONIN PO Take 5 mg by mouth as needed.     [provider]  Multiple Vitamin (MULTIVITAMIN WITH MINERALS) TABS tablet Take 1 tablet by mouth as needed.     [provider]  SYNTHROID 125 MCG tablet  10/23/17   [provider]      Allergies    Amlodipine, Beta adrenergic blockers, Codeine, Lisinopril, and Vicodin [hydrocodone-acetaminophen]    Review of Systems   Review of Systems  Respiratory:  Positive for shortness of breath.   Cardiovascular:  Positive for chest pain.  Gastrointestinal:  Positive for nausea and vomiting. Negative for abdominal pain.  Musculoskeletal:  Positive for back pain.   Physical Exam Updated Vital Signs BP (!) 176/91   Pulse 86   Temp 97.8 F (36.6 C) (Oral)   Resp (!) 22   Ht 5' (1.524 m)   Wt 73.9 kg   LMP 02/23/1993   SpO2 98%   BMI 31.83 kg/m  Physical Exam Vitals and nursing note reviewed.  Constitutional:      General: She is in acute distress (in pain).     Appearance: She is well-developed. She is not diaphoretic.  HENT:     Head: Normocephalic and atraumatic.  Cardiovascular:     Rate and Rhythm: Regular rhythm. Bradycardia present.     Pulses:  Radial pulses are 2+ on the right side and 2+ on the left side.       Dorsalis pedis pulses are 2+ on the right side and 2+ on the left side.     Heart sounds: Normal heart sounds.  Pulmonary:     Effort: Pulmonary effort is normal.     Breath sounds: Normal breath sounds.  Abdominal:     Palpations: Abdomen is soft.     Tenderness: There is no abdominal tenderness.  Skin:    General: Skin is warm and dry.  Neurological:     Mental Status: She is alert.    ED Results / Procedures / Treatments   Labs (all labs ordered are listed, but only abnormal results are displayed) Labs Reviewed  BASIC METABOLIC PANEL - Abnormal; Notable for the following components:      Result Value   Potassium 3.4 (*)    Glucose, Bld 149 (*)     BUN 24 (*)    Creatinine, Ser 1.21 (*)    GFR, Estimated 48 (*)    All other components within normal limits  CBC WITH DIFFERENTIAL/PLATELET - Abnormal; Notable for the following components:   WBC 13.0 (*)    Neutro Abs 9.6 (*)    All other components within normal limits  I-STAT CREATININE, ED - Abnormal; Notable for the following components:   Creatinine, Ser 1.30 (*)    All other components within normal limits  TROPONIN I (HIGH SENSITIVITY)    EKG EKG Interpretation  Date/Time:  Thursday Jul 17 2021 23:12:03 EDT Ventricular Rate:  51 PR Interval:  193 QRS Duration: 140 QT Interval:  490 QTC Calculation: 452 R Axis:   -12 Text Interpretation: Sinus rhythm Left bundle branch block ST changes similar to earlier in the day Confirmed by Sherwood Gambler [phone]) on 07/17/2021 11:13:24 PM  Radiology DG Chest Portable 1 View  Result Date: 07/17/2021 CLINICAL DATA:  Chest and back pain. EXAM: PORTABLE CHEST 1 VIEW COMPARISON:  Chest x-ray 08/17/2012 FINDINGS: The heart size and mediastinal contours are within normal limits. Both lungs are clear. The visualized skeletal structures are unremarkable. IMPRESSION: No active disease. Electronically Signed   By: Ronney Asters M.D.   On: 07/17/2021 22:39    Procedures .Critical Care Performed by: Sherwood Gambler, MD Authorized by: Sherwood Gambler, MD   Critical care provider statement:    Critical care time (minutes):  35   Critical care time was exclusive of:  Separately billable procedures and treating other patients   Critical care was necessary to treat or prevent imminent or life-threatening deterioration of the following conditions:  Circulatory failure and cardiac failure   Critical care was time spent personally by me on the following activities:  Development of treatment plan with patient or surrogate, discussions with consultants, evaluation of patient's response to treatment, examination of patient, ordering and review of  laboratory studies, ordering and review of radiographic studies, ordering and performing treatments and interventions, pulse oximetry, re-evaluation of patient's condition and review of old charts    Medications Ordered in ED Medications  nitroGLYCERIN (NITROSTAT) SL tablet 0.4 mg (0.4 mg Sublingual Given 07/17/21 2311)  nitroGLYCERIN 50 mg in dextrose 5 % 250 mL (0.2 mg/mL) infusion (has no administration in time range)  morphine (PF) 4 MG/ML injection 4 mg (4 mg Intravenous Given 07/17/21 2240)  iohexol (OMNIPAQUE) 350 MG/ML injection 100 mL (100 mLs Intravenous Contrast Given 07/17/21 2300)  HYDROmorphone (DILAUDID) injection 1 mg (1 mg Intravenous Given 07/17/21  2324)  ondansetron (ZOFRAN) injection 4 mg (4 mg Intravenous Given 07/17/21 2323)    ED Course/ Medical Decision Making/ A&P                           Medical Decision Making Amount and/or Complexity of Data Reviewed Independent Historian: EMS External Data Reviewed: notes. Labs: ordered. Radiology: ordered and independent interpretation performed. ECG/medicine tests: ordered and independent interpretation performed.  Risk Prescription drug management.   Patient presents with significant chest pain and hypertension.  Given the back pain I have also ordered a CT angiography of her chest.  Chest x-ray image reviewed by myself and there is no obvious mediastinal widening or pneumothorax.  ECG shows some subtle ST depressions but they seem relatively similar to 2014.  Seem to have some transient relief with a little bit of morphine and nitro and her blood pressure came down to 145 but now is going back up and her pain is intractable and she is vomiting.  Initial troponin is okay though is only been a couple hours since her symptoms started.  She will be put on a nitro drip and given IV Dilaudid.  If her CTA is okay she will need admission for blood pressure control and possibly cardiac catheterization for uncontrolled anginal type pain  despite medical therapy.  Care transferred to Dr. Randal Buba.        Final Clinical Impression(s) / ED Diagnoses Final diagnoses:  None    Rx / DC Orders ED Discharge Orders     None         Sherwood Gambler, MD 07/17/21 2329

## 2021-07-17 NOTE — ED Notes (Signed)
Patient transported to CT 

## 2021-07-17 NOTE — ED Notes (Signed)
Pt actively vomiting at this time. Provider made aware of same

## 2021-07-18 ENCOUNTER — Encounter (HOSPITAL_COMMUNITY): Payer: Self-pay | Admitting: Emergency Medicine

## 2021-07-18 ENCOUNTER — Inpatient Hospital Stay (HOSPITAL_COMMUNITY): Payer: Medicare Other

## 2021-07-18 ENCOUNTER — Emergency Department (HOSPITAL_COMMUNITY): Payer: Medicare Other

## 2021-07-18 DIAGNOSIS — Z8719 Personal history of other diseases of the digestive system: Secondary | ICD-10-CM | POA: Diagnosis not present

## 2021-07-18 DIAGNOSIS — I161 Hypertensive emergency: Secondary | ICD-10-CM | POA: Diagnosis present

## 2021-07-18 DIAGNOSIS — E039 Hypothyroidism, unspecified: Secondary | ICD-10-CM | POA: Diagnosis present

## 2021-07-18 DIAGNOSIS — I169 Hypertensive crisis, unspecified: Secondary | ICD-10-CM

## 2021-07-18 DIAGNOSIS — R079 Chest pain, unspecified: Secondary | ICD-10-CM

## 2021-07-18 DIAGNOSIS — I447 Left bundle-branch block, unspecified: Secondary | ICD-10-CM | POA: Diagnosis present

## 2021-07-18 DIAGNOSIS — R1013 Epigastric pain: Secondary | ICD-10-CM | POA: Diagnosis not present

## 2021-07-18 DIAGNOSIS — Z79899 Other long term (current) drug therapy: Secondary | ICD-10-CM | POA: Diagnosis not present

## 2021-07-18 DIAGNOSIS — Z79890 Hormone replacement therapy (postmenopausal): Secondary | ICD-10-CM | POA: Diagnosis not present

## 2021-07-18 DIAGNOSIS — R109 Unspecified abdominal pain: Secondary | ICD-10-CM | POA: Diagnosis not present

## 2021-07-18 DIAGNOSIS — R112 Nausea with vomiting, unspecified: Secondary | ICD-10-CM | POA: Diagnosis not present

## 2021-07-18 DIAGNOSIS — Z7982 Long term (current) use of aspirin: Secondary | ICD-10-CM | POA: Diagnosis not present

## 2021-07-18 DIAGNOSIS — R001 Bradycardia, unspecified: Secondary | ICD-10-CM | POA: Diagnosis not present

## 2021-07-18 DIAGNOSIS — R1110 Vomiting, unspecified: Secondary | ICD-10-CM | POA: Diagnosis not present

## 2021-07-18 DIAGNOSIS — M810 Age-related osteoporosis without current pathological fracture: Secondary | ICD-10-CM | POA: Diagnosis present

## 2021-07-18 DIAGNOSIS — Z888 Allergy status to other drugs, medicaments and biological substances status: Secondary | ICD-10-CM | POA: Diagnosis not present

## 2021-07-18 DIAGNOSIS — K8020 Calculus of gallbladder without cholecystitis without obstruction: Secondary | ICD-10-CM | POA: Diagnosis not present

## 2021-07-18 DIAGNOSIS — K8010 Calculus of gallbladder with chronic cholecystitis without obstruction: Secondary | ICD-10-CM | POA: Diagnosis not present

## 2021-07-18 DIAGNOSIS — G932 Benign intracranial hypertension: Secondary | ICD-10-CM | POA: Diagnosis not present

## 2021-07-18 DIAGNOSIS — K8000 Calculus of gallbladder with acute cholecystitis without obstruction: Secondary | ICD-10-CM | POA: Diagnosis present

## 2021-07-18 DIAGNOSIS — Z885 Allergy status to narcotic agent status: Secondary | ICD-10-CM | POA: Diagnosis not present

## 2021-07-18 LAB — CBC
HCT: 37 % (ref 36.0–46.0)
Hemoglobin: 12.5 g/dL (ref 12.0–15.0)
MCH: 26.9 pg (ref 26.0–34.0)
MCHC: 33.8 g/dL (ref 30.0–36.0)
MCV: 79.7 fL — ABNORMAL LOW (ref 80.0–100.0)
Platelets: 285 10*3/uL (ref 150–400)
RBC: 4.64 MIL/uL (ref 3.87–5.11)
RDW: 13.7 % (ref 11.5–15.5)
WBC: 11.7 10*3/uL — ABNORMAL HIGH (ref 4.0–10.5)
nRBC: 0 % (ref 0.0–0.2)

## 2021-07-18 LAB — URINALYSIS, ROUTINE W REFLEX MICROSCOPIC
Bilirubin Urine: NEGATIVE
Glucose, UA: NEGATIVE mg/dL
Hgb urine dipstick: NEGATIVE
Ketones, ur: 20 mg/dL — AB
Leukocytes,Ua: NEGATIVE
Nitrite: NEGATIVE
Protein, ur: NEGATIVE mg/dL
Specific Gravity, Urine: >1.046 — ABNORMAL HIGH (ref 1.005–1.030)
pH: 8 (ref 5.0–8.0)

## 2021-07-18 LAB — HEPATIC FUNCTION PANEL
ALT: 17 U/L (ref 0–44)
AST: 19 U/L (ref 15–41)
Albumin: 3.7 g/dL (ref 3.5–5.0)
Alkaline Phosphatase: 83 U/L (ref 38–126)
Bilirubin, Direct: 0.1 mg/dL (ref 0.0–0.2)
Indirect Bilirubin: 0.6 mg/dL (ref 0.3–0.9)
Total Bilirubin: 0.7 mg/dL (ref 0.3–1.2)
Total Protein: 6.6 g/dL (ref 6.5–8.1)

## 2021-07-18 LAB — PHOSPHORUS: Phosphorus: 3.6 mg/dL (ref 2.5–4.6)

## 2021-07-18 LAB — ECHOCARDIOGRAM COMPLETE
AR max vel: 1.84 cm2
AV Area VTI: 1.83 cm2
AV Area mean vel: 1.9 cm2
AV Mean grad: 5 mmHg
AV Peak grad: 9.4 mmHg
Ao pk vel: 1.53 m/s
Area-P 1/2: 2.83 cm2
Height: 60 in
MV VTI: 2.39 cm2
S' Lateral: 2.4 cm
Weight: 2518.54 oz

## 2021-07-18 LAB — BASIC METABOLIC PANEL
Anion gap: 10 (ref 5–15)
BUN: 18 mg/dL (ref 8–23)
CO2: 29 mmol/L (ref 22–32)
Calcium: 8.9 mg/dL (ref 8.9–10.3)
Chloride: 107 mmol/L (ref 98–111)
Creatinine, Ser: 1 mg/dL (ref 0.44–1.00)
GFR, Estimated: 60 mL/min — ABNORMAL LOW (ref >60)
Glucose, Bld: 147 mg/dL — ABNORMAL HIGH (ref 70–99)
Potassium: 3.5 mmol/L (ref 3.5–5.1)
Sodium: 146 mmol/L — ABNORMAL HIGH (ref 135–145)

## 2021-07-18 LAB — TSH: TSH: 3.248 u[IU]/mL (ref 0.350–4.500)

## 2021-07-18 LAB — MAGNESIUM: Magnesium: 2.2 mg/dL (ref 1.7–2.4)

## 2021-07-18 LAB — LIPASE, BLOOD: Lipase: 46 U/L (ref 11–51)

## 2021-07-18 LAB — GLUCOSE, CAPILLARY
Glucose-Capillary: 176 mg/dL — ABNORMAL HIGH (ref 70–99)
Glucose-Capillary: 166 mg/dL — ABNORMAL HIGH (ref 70–99)
Glucose-Capillary: 127 mg/dL — ABNORMAL HIGH (ref 70–99)

## 2021-07-18 LAB — TROPONIN I (HIGH SENSITIVITY): Troponin I (High Sensitivity): 19 ng/L — ABNORMAL HIGH (ref <18)

## 2021-07-18 LAB — MRSA NEXT GEN BY PCR, NASAL: MRSA by PCR Next Gen: NOT DETECTED

## 2021-07-18 MED ORDER — SODIUM CHLORIDE 0.9 % IV SOLN: INTRAVENOUS | Status: DC

## 2021-07-18 MED ORDER — CHLORHEXIDINE GLUCONATE CLOTH 2 % EX PADS: 6.0000 | MEDICATED_PAD | Freq: Every day | CUTANEOUS | Status: DC

## 2021-07-18 MED ORDER — HYDROCHLOROTHIAZIDE 25 MG PO TABS
25.0000 mg | ORAL_TABLET | Freq: Every morning | ORAL | Status: DC
  Administered 2021-07-18 – 2021-07-20 (×3): 25 mg via ORAL
  Filled 2021-07-18 (×3): qty 1

## 2021-07-18 MED ORDER — NICARDIPINE HCL IN NACL 20-0.86 MG/200ML-% IV SOLN
3.0000 mg/h | INTRAVENOUS | Status: DC
  Filled 2021-07-18: qty 200

## 2021-07-18 MED ORDER — LEVOTHYROXINE SODIUM 25 MCG PO TABS
125.0000 ug | ORAL_TABLET | Freq: Every day | ORAL | Status: DC
  Administered 2021-07-19 – 2021-07-20 (×2): 125 ug via ORAL
  Filled 2021-07-18 (×2): qty 1

## 2021-07-18 MED ORDER — PANTOPRAZOLE SODIUM 40 MG IV SOLR
40.0000 mg | Freq: Every day | INTRAVENOUS | Status: DC
  Administered 2021-07-18: 40 mg via INTRAVENOUS
  Filled 2021-07-18: qty 10

## 2021-07-18 MED ORDER — POTASSIUM CHLORIDE 10 MEQ/100ML IV SOLN
10.0000 meq | INTRAVENOUS | Status: AC
  Administered 2021-07-18 (×4): 10 meq via INTRAVENOUS
  Filled 2021-07-18 (×4): qty 100

## 2021-07-18 MED ORDER — TRIMETHOBENZAMIDE HCL 100 MG/ML IM SOLN
200.0000 mg | INTRAMUSCULAR | Status: AC
  Administered 2021-07-18: 200 mg via INTRAMUSCULAR
  Filled 2021-07-18: qty 2

## 2021-07-18 MED ORDER — PERFLUTREN LIPID MICROSPHERE
1.0000 mL | INTRAVENOUS | Status: AC | PRN
  Administered 2021-07-18: 3 mL via INTRAVENOUS

## 2021-07-18 MED ORDER — LORAZEPAM 0.5 MG PO TABS
0.5000 mg | ORAL_TABLET | Freq: Once | ORAL | Status: AC
  Administered 2021-07-19: 0.5 mg via ORAL
  Filled 2021-07-18: qty 1

## 2021-07-18 MED ORDER — MORPHINE BOLUS VIA INFUSION
3.0000 mg | Freq: Once | INTRAVENOUS | Status: DC
  Filled 2021-07-18: qty 3

## 2021-07-18 MED ORDER — DILTIAZEM HCL ER COATED BEADS 180 MG PO CP24
180.0000 mg | ORAL_CAPSULE | Freq: Every day | ORAL | Status: DC
  Administered 2021-07-18 – 2021-07-20 (×3): 180 mg via ORAL
  Filled 2021-07-18 (×3): qty 1

## 2021-07-18 MED ORDER — PROCHLORPERAZINE EDISYLATE 10 MG/2ML IJ SOLN
10.0000 mg | Freq: Four times a day (QID) | INTRAMUSCULAR | Status: DC | PRN
  Administered 2021-07-18 – 2021-07-19 (×2): 10 mg via INTRAVENOUS
  Filled 2021-07-18 (×3): qty 2

## 2021-07-18 MED ORDER — MORPHINE SULFATE (PF) 4 MG/ML IV SOLN
3.0000 mg | Freq: Once | INTRAVENOUS | Status: AC
  Administered 2021-07-18: 3 mg via INTRAVENOUS
  Filled 2021-07-18: qty 1

## 2021-07-18 MED ORDER — ORAL CARE MOUTH RINSE
15.0000 mL | Freq: Two times a day (BID) | OROMUCOSAL | Status: DC
  Administered 2021-07-18 – 2021-07-20 (×3): 15 mL via OROMUCOSAL

## 2021-07-18 MED ORDER — TECHNETIUM TC 99M MEBROFENIN IV KIT
5.1000 | PACK | Freq: Once | INTRAVENOUS | Status: AC | PRN
  Administered 2021-07-18: 5.1 via INTRAVENOUS

## 2021-07-18 MED ORDER — MELATONIN 3 MG PO TABS
1.5000 mg | ORAL_TABLET | Freq: Once | ORAL | Status: DC
  Filled 2021-07-18: qty 1

## 2021-07-18 MED ORDER — SODIUM CHLORIDE 0.9 % IV SOLN
2.0000 g | INTRAVENOUS | Status: DC
  Administered 2021-07-19 – 2021-07-20 (×2): 2 g via INTRAVENOUS
  Filled 2021-07-18 (×3): qty 20

## 2021-07-18 MED ORDER — CHLORHEXIDINE GLUCONATE CLOTH 2 % EX PADS
6.0000 | MEDICATED_PAD | Freq: Every day | CUTANEOUS | Status: DC
  Administered 2021-07-18: 6 via TOPICAL

## 2021-07-18 MED ORDER — SODIUM BICARBONATE 8.4 % IV SOLN
50.0000 meq | Freq: Once | INTRAVENOUS | Status: AC
Start: 2021-07-18 — End: 2021-07-18
  Administered 2021-07-18: 50 meq via INTRAVENOUS
  Filled 2021-07-18: qty 50

## 2021-07-18 MED ORDER — ASPIRIN 81 MG PO TBEC
81.0000 mg | DELAYED_RELEASE_TABLET | Freq: Every day | ORAL | Status: DC
  Administered 2021-07-18 – 2021-07-20 (×3): 81 mg via ORAL
  Filled 2021-07-18 (×3): qty 1

## 2021-07-18 MED ORDER — DOCUSATE SODIUM 100 MG PO CAPS: 100.0000 mg | ORAL_CAPSULE | Freq: Two times a day (BID) | ORAL | Status: DC | PRN

## 2021-07-18 MED ORDER — POLYETHYLENE GLYCOL 3350 17 G PO PACK: 17.0000 g | PACK | Freq: Every day | ORAL | Status: DC | PRN

## 2021-07-18 MED ORDER — SODIUM CHLORIDE 0.9 % IV SOLN
2.0000 g | Freq: Once | INTRAVENOUS | Status: AC
  Administered 2021-07-18: 2 g via INTRAVENOUS
  Filled 2021-07-18: qty 20

## 2021-07-18 NOTE — ED Provider Notes (Addendum)
[name] Provider Note   CSN: [id number] Arrival date & time: 07/17/21  2206     History  Chief Complaint  Patient presents with   Hypertension    240/82   I assumed care of this patient from Dr. Regenia Skeeter at 2335, nurse came to EDP about bradycardia and diaphoresis following pain medication ordered by previous attending and I went to see patient.   Bridget [name] is a 71 y.o. female.  The history is provided by the patient.  Hypertension This is a new problem. The current episode started 3 to 5 hours ago. The problem occurs constantly. The problem has not changed since onset.Pertinent negatives include no abdominal pain, no headaches and no shortness of breath. Associated symptoms comments: Chest tightness to the back and nausea and vomiting and feeling of indigestion . Nothing aggravates the symptoms. Nothing relieves the symptoms. She has tried ASA and acetaminophen (nitroglycerin) for the symptoms. The treatment provided no relief.  Patient with HTN presents with new onset worsening HTN and CP that is "vice like" that radiates to the back.  It is associated with intractable nausea and emesis.  It was unresponsive to 2 SL NTG, 324 ASA and tylenol at home.  It is unresponsive to morphine and dilaudid given in the ED prior to me seeing the patient.     Past Medical History:  Diagnosis Date   Abnormal Pap smear of cervix    Heart murmur    Hypertension    Hypothyroid    Kidney stones    Osteoporosis    & osteopenia    Home Medications Prior to Admission medications   Medication Sig Start Date End Date Taking? Authorizing Provider  acetaminophen (TYLENOL) 500 MG tablet Take 1,000 mg by mouth every 6 (six) hours as needed. For pain    [provider]  ASPIRIN 81 PO Take by mouth.    [provider]  diltiazem (CARDIZEM CD) 180 MG 24 hr capsule Take 180 mg by mouth daily. 09/10/14   [provider]  Docusate  Sodium (COLACE PO) Take 100 mg by mouth daily as needed. constipation    [provider]  hydrochlorothiazide (HYDRODIURIL) 25 MG tablet Take 25 mg by mouth every morning. 03/13/19   [provider]  ibuprofen (ADVIL,MOTRIN) 200 MG tablet Take 400 mg by mouth every 6 (six) hours as needed for moderate pain.    [provider]  LORazepam (ATIVAN) 1 MG tablet Take 0.5 mg by mouth every 8 (eight) hours as needed. For anxiety    [provider]  MELATONIN PO Take 5 mg by mouth as needed.     [provider]  Multiple Vitamin (MULTIVITAMIN WITH MINERALS) TABS tablet Take 1 tablet by mouth as needed.     [provider]  SYNTHROID 125 MCG tablet  10/23/17   [provider]      Allergies    Amlodipine, Beta adrenergic blockers, Codeine, Lisinopril, and Vicodin [hydrocodone-acetaminophen]    Review of Systems   Review of Systems  Constitutional:  Positive for diaphoresis. Negative for fever.  HENT:  Negative for congestion.   Eyes:  Negative for redness.  Respiratory:  Negative for shortness of breath.   Gastrointestinal:  Positive for nausea and vomiting. Negative for abdominal pain.  Neurological:  Negative for headaches.  All other systems reviewed and are negative.  Physical Exam Updated Vital Signs BP (!) 182/56   Pulse 76   Temp 97.8 F (  36.6 C) (Oral)   Resp (!) 22   Ht 5' (1.524 m)   Wt 73.9 kg   LMP 02/23/1993   SpO2 98%   BMI 31.83 kg/m  Physical Exam Vitals and nursing note reviewed.  Constitutional:      General: She is not in acute distress.    Appearance: Normal appearance. She is diaphoretic.  HENT:     Head: Normocephalic and atraumatic.     Nose: Nose normal.  Eyes:     Conjunctiva/sclera: Conjunctivae normal.     Pupils: Pupils are equal, round, and reactive to light.  Cardiovascular:     Rate and Rhythm: Regular rhythm. Bradycardia present.     Pulses: Normal pulses.     Heart sounds: Normal  heart sounds.  Pulmonary:     Effort: Pulmonary effort is normal.     Breath sounds: Normal breath sounds.  Abdominal:     General: Abdomen is flat. Bowel sounds are normal.     Palpations: Abdomen is soft.     Tenderness: There is no abdominal tenderness. There is no guarding.  Musculoskeletal:        General: Normal range of motion.     Cervical back: Normal range of motion and neck supple.     Right lower leg: No edema.     Left lower leg: No edema.  Skin:    General: Skin is warm.     Capillary Refill: Capillary refill takes less than 2 seconds.  Neurological:     General: No focal deficit present.     Mental Status: She is alert and oriented to person, place, and time.     Deep Tendon Reflexes: Reflexes normal.  Psychiatric:        Mood and Affect: Mood normal.        Behavior: Behavior normal.    ED Results / Procedures / Treatments   Labs (all labs ordered are listed, but only abnormal results are displayed) Labs Reviewed  BASIC METABOLIC PANEL - Abnormal; Notable for the following components:      Result Value   Potassium 3.4 (*)    Glucose, Bld 149 (*)    BUN 24 (*)    Creatinine, Ser 1.21 (*)    GFR, Estimated 48 (*)    All other components within normal limits  CBC WITH DIFFERENTIAL/PLATELET - Abnormal; Notable for the following components:   WBC 13.0 (*)    Neutro Abs 9.6 (*)    All other components within normal limits  I-STAT CREATININE, ED - Abnormal; Notable for the following components:   Creatinine, Ser 1.30 (*)    All other components within normal limits  TROPONIN I (HIGH SENSITIVITY)  TROPONIN I (HIGH SENSITIVITY)    EKG EKG Interpretation  Date/Time:  Thursday Jul 17 2021 23:12:03 EDT Ventricular Rate:  51 PR Interval:  193 QRS Duration: 140 QT Interval:  490 QTC Calculation: 452 R Axis:   -12 Text Interpretation: Sinus rhythm Left bundle branch block ST changes similar to earlier in the day Confirmed by Sherwood Gambler [phone]) on  07/17/2021 11:13:24 PM  Radiology DG Chest Portable 1 View  Result Date: 07/17/2021 CLINICAL DATA:  Chest and back pain. EXAM: PORTABLE CHEST 1 VIEW COMPARISON:  Chest x-ray 08/17/2012 FINDINGS: The heart size and mediastinal contours are within normal limits. Both lungs are clear. The visualized skeletal structures are unremarkable. IMPRESSION: No active disease. Electronically Signed   By: Ronney Asters M.D.   On: 07/17/2021 22:39  CT Angio Chest/Abd/Pel for Dissection W and/or Wo Contrast  Result Date: 07/17/2021 CLINICAL DATA:  Acute aortic syndrome suspected, chest pain. EXAM: CT ANGIOGRAPHY CHEST, ABDOMEN AND PELVIS TECHNIQUE: Non-contrast CT of the chest was initially obtained. Multidetector CT imaging through the chest, abdomen and pelvis was performed using the standard protocol during bolus administration of intravenous contrast. Multiplanar reconstructed images and MIPs were obtained and reviewed to evaluate the vascular anatomy. RADIATION DOSE REDUCTION: This exam was performed according to the departmental dose-optimization program which includes automated exposure control, adjustment of the mA and/or kV according to patient size and/or use of iterative reconstruction technique. CONTRAST:  154m OMNIPAQUE IOHEXOL 350 MG/ML SOLN COMPARISON:  10/16/2005. FINDINGS: CTA CHEST FINDINGS Cardiovascular: The heart is normal in size and there is no pericardial effusion. There is mild atherosclerotic calcification of the aorta without evidence of aneurysm or dissection. The aortic arch has a bovine configuration. The pulmonary trunk is normal in caliber. Mediastinum/Nodes: No enlarged mediastinal, hilar, or axillary lymph nodes. Thyroid gland, trachea, and esophagus demonstrate no significant findings. Lungs/Pleura: Calcified granuloma and scarring are noted in the medial aspect of the right lower lobe. Mild atelectasis or scarring is noted bilaterally. No consolidation, effusion, or pneumothorax.  Musculoskeletal: Mild degenerative changes in the thoracic spine. No acute osseous abnormality. Review of the MIP images confirms the above findings. CTA ABDOMEN AND PELVIS FINDINGS VASCULAR Aorta: Aortic atherosclerosis. Normal caliber aorta without aneurysm, dissection, vasculitis or significant stenosis. Celiac: Patent without evidence of aneurysm, dissection, or vasculitis. There is focal narrowing of the proximal celiac artery which may be due to diaphragmatic position versus median arcuate ligament syndrome. SMA: Patent without evidence of aneurysm, dissection, vasculitis or significant stenosis. Renals: Both renal arteries are patent without evidence of aneurysm, dissection, vasculitis, fibromuscular dysplasia or significant stenosis. An accessory renal artery is noted on the left. IMA: Patent without evidence of aneurysm, dissection, vasculitis or significant stenosis. Inflow: Patent without evidence of aneurysm, dissection, vasculitis or significant stenosis. Veins: No obvious venous abnormality within the limitations of this arterial phase study. Review of the MIP images confirms the above findings. NON-VASCULAR Hepatobiliary: No focal liver abnormality is seen. Multiple stones are present within the gallbladder. No biliary ductal dilatation. Pancreas: Unremarkable. No pancreatic ductal dilatation or surrounding inflammatory changes. Spleen: Normal in size without focal abnormality. Adrenals/Urinary Tract: There is a 2.6 cm nodule in the right adrenal gland with attenuation -4 Hounsfield units, compatible with an adenoma. The left adrenal gland is within normal limits. The kidneys enhance symmetrically. No renal calculus or hydronephrosis. Bladder is unremarkable. Stomach/Bowel: Stomach is within normal limits. The appendix is not visualized on exam and surgical changes are noted at the cecum. No evidence of bowel wall thickening, distention, or inflammatory changes. No free air or pneumatosis. Lymphatic:  No abdominal or pelvic lymphadenopathy. Reproductive: Uterus and bilateral adnexa are unremarkable. Other: Small fat containing umbilical hernia. No abdominopelvic ascites. Musculoskeletal: Degenerative changes are present in the lumbar spine. No acute osseous abnormality. Review of the MIP images confirms the above findings. IMPRESSION: 1. No evidence of aortic aneurysm or dissection. 2. No acute process in the chest, abdomen, or pelvis. 3. Cholelithiasis. 4. Right adrenal adenoma. Electronically Signed   By: LBrett FairyM.D.   On: 07/17/2021 23:34    Procedures Procedures    Medications Ordered in ED Medications  nicardipine (CARDENE) '20mg'$  in 0.86% saline 2040mIV infusion (0.1 mg/ml) (has no administration in time range)  morphine (PF) 4 MG/ML injection 4 mg (4 mg  Intravenous Given 07/17/21 2240)  iohexol (OMNIPAQUE) 350 MG/ML injection 100 mL (100 mLs Intravenous Contrast Given 07/17/21 2300)  HYDROmorphone (DILAUDID) injection 1 mg (1 mg Intravenous Given 07/17/21 2324)  ondansetron (ZOFRAN) injection 4 mg (4 mg Intravenous Given 07/17/21 2323)  sodium bicarbonate injection 50 mEq (50 mEq Intravenous Given 07/18/21 0000)    ED Course/ Medical Decision Making/ A&P                           Medical Decision Making Problems Addressed: Prolonged Q-T interval on ECG: acute illness or injury    Details: improves with bicarb, will avoid QT prolonging agents  Amount and/or Complexity of Data Reviewed External Data Reviewed: notes.    Details: previous notes reviewed Labs: ordered. Radiology: ordered. Discussion of management or test interpretation with external provider(s): Cardiology fellow, Dr. Terri Skains, who saw patient.  Pain is not from ischemia.  Start cardene.  No further recommendations at this time  Case d/w Dr. Ruthann Cancer of PCCM who will see the patient.   Case d/w Jenny Reichmann of pharmacy regarding QT neutral anti-emetics.   146 Case d/w Dr. Grandville Silos Of surgery.  CCS will  consult  Risk Prescription drug management. Decision regarding hospitalization. Risk Details: Given the ongoing pain with multiple doses of IV narcotic pain medication but no relief in the patient's symptoms but intermittent bradycardia and hypoxia as well as worsening emesis with negative first troponin and dissection study I consulted cardiology.  Moreover, this EDP decided to start investigating other causes of patient's symptoms.  I immediately ordered US of the GB in addition to LFTs and Lipase.  Patient has prolongation of the QT and QTc and EDP spoke with pharmacy regarding QT neutral anti-emetic to relieve patient's symptoms.  IM Tigan is QT neutral in and will not affect sensorium or BP.  I have chosen this drug for nausea.  Cardene was ordered and initiated by me in the ED.  Patient will be admitted to the ICU. [place] for GB with thickened GB wall I have consulted general surgery regarding patient care.  Appropriate antibiotics for cholecystitis have been ordered by me.  I have updated the patient  Critical Care Total time providing critical care: 30 minutes (Consults to cardiology and ICU and cardene started by me.  In addition to review of all imaging labs and previous note from Dr. Regenia Skeeter )   Final Clinical Impression(s) / ED Diagnoses Final diagnoses:  Hypertensive crisis  Bradycardia  Prolonged Q-T interval on ECG   The patient appears reasonably stabilized for admission considering the current resources, flow, and capabilities available in the ED at this time, and I doubt any other [facility] requiring further screening and/or treatment in the ED prior to admission.         Lounette Sloan, MD 07/18/21 1594

## 2021-07-18 NOTE — ED Notes (Signed)
Pt continues to actively vomit at this time. Provider is aware of same

## 2021-07-18 NOTE — Progress Notes (Signed)
NAME:  Bridget [name], MRN:  [id number], DOB:  1951/02/03, LOS: 0 ADMISSION DATE:  07/17/2021, CONSULTATION DATE:  07/18/21 REFERRING MD:  EDP, CHIEF COMPLAINT:  CP/hypertension   History of Present Illness:  71 yo female with pmh htn, hypothyroidism compliant with meds at home presented with severe CP from home. Initially starting as indigestion like symptoms not relieved with tums. She noted her BP was in mid 200's.  She then took asa and tylenol and called ems. Ems provided 0.'4mg'$  SL nitro with some improvement in BP from 240's to 190's. However, pt's CP was reportedly exacerbated by nitro.  On presentation her SBP 210, she endorsed feeling like "vice grip" around back without ability to get a full breath. She then began vomiting profusely.  No attempts as medicating the pain have been successful. She underwent ct chest,abd,pelvis to eval for dissection which was thankfully negative.   She was started on zofran/compazine and cardene infusion for BP. At some point in ed pt became bradycardic with vomtiing. Cardiology was consulted as well and does not feel that ACS is etiology of symptoms.   CCM has been asked to admit pt 2/2 cardene infusion. Upon eval pt was vomiting again. So full exam was challenging to complete. Decision was made to obtain cth when able to lay flat and not actively vomiting.   Pertinent  Medical History  Htn hypothyroidism  Significant Hospital Events: Including procedures, antibiotic start and stop dates in addition to other pertinent events   5/26 admitted to ICU for cardene infusion 5/26 off cardene gtt  Interim History / Subjective:   Feels better now, just worried why BP was so high. Reports strict compliance with home medications including BP meds (HCTZ, diltiazem). States she had side effects with other BP meds in the past (norvasc, lisinopril, beta blockers) and diltiazem had been working for her. No recent changes in lifestyle.   Still intermittently  feeling nauseous, but gradually improving.  Objective   Blood pressure 136/61, pulse (!) 58, temperature 97.6 F (36.4 C), temperature source Oral, resp. rate 15, height 5' (1.524 m), weight 71.4 kg, last menstrual period 02/23/1993, SpO2 94 %.        Intake/Output Summary (Last 24 hours) at 07/18/2021 0907 Last data filed at 07/18/2021 0800 Gross per 24 hour  Intake 476.99 ml  Output 625 ml  Net -148.01 ml   Filed Weights   07/17/21 2221 07/18/21 0215  Weight: 73.9 kg 71.4 kg    Examination: General: pleasant elderly female, lying in bed, NAD. HENT: anicteric sclera, mucus membrane moist. CV: bradycardic rate and regular rhythm, no m/r/g. Pulm: CTABL, no adventitious sounds noted. Normal WOB on RA.  Abdomen: soft, nondistended, nontender to superficial and deep palpation. +BS. Skin: warm and dry. Neuro: AAOx3, at baseline mentation, no focal deficits noted.   Resolved Hospital Problem list     Assessment & Plan:  N/V Hypertensive emergency BP has improved significantly. CT head negative. CT dissection study negative for aortic dissection. No longer requiring cardene drip. Will restart home BP medications (HCTZ, diltiazem) and assess BP control. May need another agent added if not adequately controlled. Would consider losartan (cough with lisinopril). Had mild fatigue with beta blockers in the past, but she would be willing to try again if needed. RUQ U/S showing cholelithiasis with slight GBW thickening. HIDA ordered per trauma given N/V in the setting of hypertensive emergency. No longer meeting ICU level of care, will transfer to floors today. -appreciate cardiology assistance -off  cardene gtt -transition to home PO HCTZ and diltiazem -consider losartan and/or beta blocker if need additional agents -appreciate trauma assistance, f/u HIDA results -compazine for N/V (QTc 426m) -transfer out of ICU today, TRH to follow  LBBB  -cardiology following  Hypothyroidism -TSH  wnl -restarted home synthroid   Best Practice (right click and "Reselect all SmartList Selections" daily)   Diet/type: NPO for HIDA DVT prophylaxis: SCD GI prophylaxis: N/A Lines: N/A Foley:  N/A Code Status:  full code Last date of multidisciplinary goals of care discussion [5/26]  Critical care time: 34 minutes    JVirl Axe MD IMTS PGY-2 07/18/2021, 9:07 AM

## 2021-07-18 NOTE — Discharge Instructions (Signed)
[facility]  LAPAROSCOPIC SURGERY: POST OP INSTRUCTIONS Always review your discharge instruction sheet given to you by the facility where your surgery was performed. IF YOU HAVE DISABILITY OR FAMILY LEAVE FORMS, YOU MUST BRING THEM TO THE OFFICE FOR PROCESSING.   DO NOT GIVE THEM TO YOUR DOCTOR.  PAIN CONTROL  Pain regimen: take over-the-counter tylenol (acetaminophen) 1000mg every six hours, the prescription ibuprofen (600mg) every six hours and the robaxin (methocarbamol) 750mg every six hours. With all three of these, you should be taking something every two hours. Example: tylenol ( acetaminophen) at 8am, ibuprofen at 10am, robaxin (methocarbamol) at 12pm, tylenol (acetaminophen) again at 2pm, ibuprofen again at 4pm, robaxin (methocarbamol) at 6pm. You also have a prescription for oxycodone, which should be taken if the tylenol (acetaminophen), ibuprofen, and robaxin (methocarbamol) are not enough to control your pain. You may take the oxycodone as frequently as every four hours as needed, but if you are taking the other medications as above, you should not need the oxycodone this frequently. You have also been given a prescription for colace (docusate) which is a stool softener. Please take this as prescribed because the oxycodone can cause constipation and the colace (docusate) will minimize or prevent constipation. Do not drive while taking or under the influence of the oxycodone as it is a narcotic medication. Use ice packs to help control pain. If you need a refill on your pain medication, please contact your pharmacy.  They will contact our office to request authorization. Prescriptions will not be filled after 5pm or on week-ends.  HOME MEDICATIONS Take your usually prescribed medications unless otherwise directed.  DIET You should follow a light diet the first few days after arrival home.  Be sure to include lots of fluids daily.   CONSTIPATION It is common to  experience some constipation after surgery and if you are taking pain medication.  Increasing fluid intake and taking a stool softener (such as Colace) will usually help or prevent this problem from occurring.  A mild laxative (Milk of Magnesia or Miralax) should be taken according to package instructions if there are no bowel movements after 48 hours.  WOUND/INCISION CARE Most patients will experience some swelling and bruising in the area of the incisions.  Ice packs will help.  Swelling and bruising can take several days to resolve.  May shower beginning 07/20/21.  Do not peel off or scrub skin glue. May allow warm soapy water to run over incision, then rinse and pat dry.  Do not soak in any water (tubs, hot tubs, pools, lakes, oceans) for one week.   ACTIVITIES You may resume regular (light) daily activities beginning the next day--such as daily self-care, walking, climbing stairs--gradually increasing activities as tolerated.  You may have sexual intercourse when it is comfortable.   No lifting greater than 5 pounds for six weeks.  You may drive when you are no longer taking narcotic pain medication, you can comfortably wear a seatbelt, and you can safely maneuver your car and apply brakes.  FOLLOW-UP You should see your doctor in the office for a follow-up appointment approximately 2-3 weeks after your surgery.  You should have been given your post-op/follow-up appointment when your surgery was scheduled.  If you did not receive a post-op/follow-up appointment, make sure that you call for this appointment within a day or two after you arrive home to insure a convenient appointment time.  WHEN TO CALL YOUR DOCTOR: Fever over 101.5 Inability to urinate   Continued bleeding from incision. Increased pain, redness, or drainage from the incision. Increasing abdominal pain  The clinic staff is available to answer your questions during regular business hours.  Please don't hesitate to call and ask  to speak to one of the nurses for clinical concerns.  If you have a medical emergency, go to the nearest emergency room or call 911.  A surgeon from [facility] is always on call at the hospital. [address], [address], [place], [place]  [address] ? [address] [address], [place], [place]   [address] [phone] ? [phone] ? FAX [phone] Web site: [email or web address]   

## 2021-07-18 NOTE — ED Notes (Signed)
Pharm tech at bedside. Pt is constantly c/o feeling tired

## 2021-07-18 NOTE — ED Notes (Signed)
Cardio at bedside. Repeat ekg obtained and provided

## 2021-07-18 NOTE — ED Notes (Signed)
Per ed provider admin bicarb and repeat 12 lead in 5 min. Pt is now actively vomiting with ed provider at bedside at this time

## 2021-07-18 NOTE — Progress Notes (Signed)
[facility] Progress Note Patient Name: Bridget [name] DOB: 1950/08/11 MRN: [id number]   Date of Service  07/18/2021  HPI/Events of Note  Emesis - Zofran reported to have prolonged QTc interval.   eICU Interventions  Plan: Compazine 10 mg IV Q 6 hours PRN N/V.     Intervention Category Major Interventions: Other:  Marleen Moret Cornelia Copa 07/18/2021, 2:13 AM

## 2021-07-18 NOTE — Progress Notes (Signed)
PCCM:  Please see H&P from earlier this AM HTN resolved  HIDA pending  Off cardene  Stable for transfer from the ICU to [facility] 07/18/2021 9:05 AM

## 2021-07-18 NOTE — ED Notes (Signed)
NT placed PureWick.

## 2021-07-18 NOTE — Progress Notes (Signed)
    Consult note from earlier this morning reviewed.  Agree.  Please let us know if we can be of further assistance.  Candee Furbish, MD

## 2021-07-18 NOTE — Progress Notes (Signed)
  Echocardiogram 2D Echocardiogram has been performed.  Bridget [name] 07/18/2021, 8:42 AM

## 2021-07-18 NOTE — Care Management (Signed)
  Transition of Care [facility]) Screening Note   Patient Details  Name: Bridget [name] Date of Birth: 1950/09/22   Transition of [facility]) CM/SW Contact:    Bethena Roys, RN Phone Number: 07/18/2021, 12:23 PM    Transition of Care Department [facility]) has reviewed the patient and no TOC needs have been identified at this time. Case Manager will continue to follow for additional transition of care needs.

## 2021-07-18 NOTE — ED Notes (Signed)
Ed provider at bedside at this time ?

## 2021-07-18 NOTE — Consult Note (Signed)
Reason for Consult:gallstones Referring Physician: April Polumbo  Bridget [name] is an 71 y.o. female.  HPI: 71yo F with PMHx HTN developed epigastric and lower chest pain last night.  It was quite severe and associated with vomiting.  She checked her blood pressure at home and noticed it was 212/90.  She reports another episode last Saturday that she thought was indigestion. She came to the emergency department for further evaluation.  CT scan ruled out aortic dissection but did note gallstones without complicating features.  Subsequently she had an ultrasound which showed slight gallbladder wall thickening.  I was asked to see her for further evaluation.  Of note, her blood pressure was very high in the emergency department and she has been admitted by CCM for hypertensive emergency. She has had several episodes of vomiting.  Past Medical History:  Diagnosis Date   Abnormal Pap smear of cervix    Heart murmur    Hypertension    Hypothyroid    Kidney stones    Osteoporosis    & osteopenia    Past Surgical History:  Procedure Laterality Date   APPENDECTOMY     BREAST BIOPSY Left    benign   BREAST REDUCTION SURGERY     CERVIX LESION DESTRUCTION N/A 1970   repeat pap smears all  normal   GANGLION CYST EXCISION  4/12   left foot & left arm   REDUCTION MAMMAPLASTY     small bowel injury     fistula    Family History  Problem Relation Age of Onset   Breast cancer Mother    Cancer Brother        leukemia   Emphysema Father    Heart attack Father    Other Daughter        rhematoid arthritis    Social History:  reports that she has never smoked. She has never used smokeless tobacco. She reports current alcohol use. She reports that she does not use drugs.  Allergies:  Allergies  Allergen Reactions   Amlodipine     Other reaction(s): swelling   Beta Adrenergic Blockers     Other reaction(s): fatigue   Codeine Nausea Only   Lisinopril Other (See Comments)    cough    Vicodin [Hydrocodone-Acetaminophen] Other (See Comments)    Made her feel off    Medications: I have reviewed the patient's current medications.  Results for orders placed or performed during the hospital encounter of 07/17/21 (from the past 48 hour(s))  Basic metabolic panel     Status: Abnormal   Collection Time: 07/17/21 10:33 PM  Result Value Ref Range   Sodium 140 135 - 145 mmol/L   Potassium 3.4 (L) 3.5 - 5.1 mmol/L   Chloride 107 98 - 111 mmol/L   CO2 23 22 - 32 mmol/L   Glucose, Bld 149 (H) 70 - 99 mg/dL    Comment: Glucose reference range applies only to samples taken after fasting for at least 8 hours.   BUN 24 (H) 8 - 23 mg/dL   Creatinine, Ser 1.21 (H) 0.44 - 1.00 mg/dL   Calcium 9.6 8.9 - 10.3 mg/dL   GFR, Estimated 48 (L) >60 mL/min    Comment: (NOTE) Calculated using the CKD-EPI Creatinine Equation (2021)    Anion gap 10 5 - 15    Comment: Performed at [facility] [address] [place], [place] [address]  CBC with Differential     Status: Abnormal   Collection Time:  07/17/21 10:33 PM  Result Value Ref Range   WBC 13.0 (H) 4.0 - 10.5 K/uL   RBC 4.61 3.87 - 5.11 MIL/uL   Hemoglobin 12.5 12.0 - 15.0 g/dL   HCT 37.4 36.0 - 46.0 %   MCV 81.1 80.0 - 100.0 fL   MCH 27.1 26.0 - 34.0 pg   MCHC 33.4 30.0 - 36.0 g/dL   RDW 13.5 11.5 - 15.5 %   Platelets 294 150 - 400 K/uL   nRBC 0.0 0.0 - 0.2 %   Neutrophils Relative % 74 %   Neutro Abs 9.6 (H) 1.7 - 7.7 K/uL   Lymphocytes Relative 19 %   Lymphs Abs 2.5 0.7 - 4.0 K/uL   Monocytes Relative 6 %   Monocytes Absolute 0.8 0.1 - 1.0 K/uL   Eosinophils Relative 0 %   Eosinophils Absolute 0.0 0.0 - 0.5 K/uL   Basophils Relative 0 %   Basophils Absolute 0.0 0.0 - 0.1 K/uL   Immature Granulocytes 1 %   Abs Immature Granulocytes 0.06 0.00 - 0.07 K/uL    Comment: Performed at [facility], [address] [place], [place] [address]  Troponin I (High Sensitivity)     Status: None   Collection Time: 07/17/21  10:33 PM  Result Value Ref Range   Troponin I (High Sensitivity) 13 <18 ng/L    Comment: (NOTE) Elevated high sensitivity troponin I (hsTnI) values and significant  changes across serial measurements may suggest ACS but many other  chronic and acute conditions are known to elevate hsTnI results.  Refer to the "Links" section for chest pain algorithms and additional  guidance. Performed at [facility], [place] [address] [place], [place] [address]   I-stat Creatinine, ED     Status: Abnormal   Collection Time: 07/17/21 10:37 PM  Result Value Ref Range   Creatinine, Ser 1.30 (H) 0.44 - 1.00 mg/dL  Troponin I (High Sensitivity)     Status: Abnormal   Collection Time: 07/17/21 11:40 PM  Result Value Ref Range   Troponin I (High Sensitivity) 19 (H) <18 ng/L    Comment: (NOTE) Elevated high sensitivity troponin I (hsTnI) values and significant  changes across serial measurements may suggest ACS but many other  chronic and acute conditions are known to elevate hsTnI results.  Refer to the "Links" section for chest pain algorithms and additional  guidance. Performed at [facility], [place] [address], [place], [place] [address]   Hepatic function panel     Status: None   Collection Time: 07/17/21 11:40 PM  Result Value Ref Range   Total Protein 6.6 6.5 - 8.1 g/dL   Albumin 3.7 3.5 - 5.0 g/dL   AST 19 15 - 41 U/L   ALT 17 0 - 44 U/L   Alkaline Phosphatase 83 38 - 126 U/L   Total Bilirubin 0.7 0.3 - 1.2 mg/dL   Bilirubin, Direct 0.1 0.0 - 0.2 mg/dL   Indirect Bilirubin 0.6 0.3 - 0.9 mg/dL    Comment: Performed at [facility] [address], [place], [place] [address]  Lipase, blood     Status: None   Collection Time: 07/17/21 11:40 PM  Result Value Ref Range   Lipase 46 11 - 51 U/L    Comment: Performed at [facility] [address] [place], [place] [address]  Urinalysis, Routine w reflex microscopic Urine, Clean Catch     Status: Abnormal   Collection  Time: 07/18/21  2:23 AM  Result Value Ref Range   Color, Urine YELLOW YELLOW   APPearance CLOUDY (A) CLEAR   Specific Gravity, Urine >1.046 (H) 1.005 - 1.030   pH 8.0 5.0 - 8.0   Glucose, UA NEGATIVE NEGATIVE mg/dL   Hgb urine dipstick NEGATIVE NEGATIVE   Bilirubin Urine NEGATIVE NEGATIVE   Ketones, ur 20 (A) NEGATIVE mg/dL   Protein, ur NEGATIVE NEGATIVE mg/dL   Nitrite NEGATIVE NEGATIVE   Leukocytes,Ua NEGATIVE NEGATIVE    Comment: Performed at [facility] [address], [place], [place] [address]  Glucose, capillary     Status: Abnormal   Collection Time: 07/18/21  3:38 AM  Result Value Ref Range   Glucose-Capillary 166 (H) 70 - 99 mg/dL    Comment: Glucose reference range applies only to samples taken after fasting for at least 8 hours.  CBC     Status: Abnormal   Collection Time: 07/18/21  5:49 AM  Result Value Ref Range   WBC 11.7 (H) 4.0 - 10.5 K/uL   RBC 4.64 3.87 - 5.11 MIL/uL   Hemoglobin 12.5 12.0 - 15.0 g/dL   HCT 37.0 36.0 - 46.0 %   MCV 79.7 (L) 80.0 - 100.0 fL   MCH 26.9 26.0 - 34.0 pg   MCHC 33.8 30.0 - 36.0 g/dL   RDW 13.7 11.5 - 15.5 %   Platelets 285 150 - 400 K/uL   nRBC 0.0 0.0 - 0.2 %    Comment: Performed at [facility], [place] [address], [place], [place] [address]  Basic metabolic panel     Status: Abnormal   Collection Time: 07/18/21  5:49 AM  Result Value Ref Range   Sodium 146 (H) 135 - 145 mmol/L   Potassium 3.5 3.5 - 5.1 mmol/L   Chloride 107 98 - 111 mmol/L   CO2 29 22 - 32 mmol/L   Glucose, Bld 147 (H) 70 - 99 mg/dL    Comment: Glucose reference range applies only to samples taken after fasting for at least 8 hours.   BUN 18 8 - 23 mg/dL   Creatinine, Ser 1.00 0.44 - 1.00 mg/dL   Calcium 8.9 8.9 - 10.3 mg/dL   GFR, Estimated 60 (L) >60 mL/min    Comment: (NOTE) Calculated using the CKD-EPI Creatinine Equation (2021)    Anion gap 10 5 - 15    Comment: Performed at [facility] [address] [place], [place]  [address]  Magnesium     Status: None   Collection Time: 07/18/21  5:49 AM  Result Value Ref Range   Magnesium 2.2 1.7 - 2.4 mg/dL    Comment: Performed at [facility] [address], [place], [place] [address]  Phosphorus     Status: None   Collection Time: 07/18/21  5:49 AM  Result Value Ref Range   Phosphorus 3.6 2.5 - 4.6 mg/dL    Comment: Performed at [facility] [address] [place], [place] [address]    CT HEAD WO CONTRAST (5MM)  Result Date: 07/18/2021 CLINICAL DATA:  Benign intracranial hypertension. Bradycardia with nausea and vomiting EXAM: CT HEAD WITHOUT CONTRAST TECHNIQUE: Contiguous axial images were obtained from the base of the skull through the vertex without intravenous contrast. RADIATION DOSE REDUCTION: This exam was performed according to the departmental dose-optimization program which includes automated exposure control, adjustment of the mA and/or kV according to patient size and/or use of iterative reconstruction technique. COMPARISON:  None  Available. FINDINGS: Recent contrast enhanced scan. Brain: No evidence of acute infarction, hemorrhage, hydrocephalus, extra-axial collection or mass lesion/mass effect. Age normal brain volume. Minimal white matter low-density attributed to chronic microvascular ischemia. Vascular: No hyperdense vessel or unexpected calcification. Skull: Normal. Negative for fracture or focal lesion. Sinuses/Orbits: No acute finding. IMPRESSION: Negative head CT. Electronically Signed   By: Jorje Guild M.D.   On: 07/18/2021 04:33   DG Chest Portable 1 View  Result Date: 07/17/2021 CLINICAL DATA:  Chest and back pain. EXAM: PORTABLE CHEST 1 VIEW COMPARISON:  Chest x-ray 08/17/2012 FINDINGS: The heart size and mediastinal contours are within normal limits. Both lungs are clear. The visualized skeletal structures are unremarkable. IMPRESSION: No active disease. Electronically Signed   By: Ronney Asters M.D.   On: 07/17/2021 22:39    CT Angio Chest/Abd/Pel for Dissection W and/or Wo Contrast  Result Date: 07/17/2021 CLINICAL DATA:  Acute aortic syndrome suspected, chest pain. EXAM: CT ANGIOGRAPHY CHEST, ABDOMEN AND PELVIS TECHNIQUE: Non-contrast CT of the chest was initially obtained. Multidetector CT imaging through the chest, abdomen and pelvis was performed using the standard protocol during bolus administration of intravenous contrast. Multiplanar reconstructed images and MIPs were obtained and reviewed to evaluate the vascular anatomy. RADIATION DOSE REDUCTION: This exam was performed according to the departmental dose-optimization program which includes automated exposure control, adjustment of the mA and/or kV according to patient size and/or use of iterative reconstruction technique. CONTRAST:  191m OMNIPAQUE IOHEXOL 350 MG/ML SOLN COMPARISON:  10/16/2005. FINDINGS: CTA CHEST FINDINGS Cardiovascular: The heart is normal in size and there is no pericardial effusion. There is mild atherosclerotic calcification of the aorta without evidence of aneurysm or dissection. The aortic arch has a bovine configuration. The pulmonary trunk is normal in caliber. Mediastinum/Nodes: No enlarged mediastinal, hilar, or axillary lymph nodes. Thyroid gland, trachea, and esophagus demonstrate no significant findings. Lungs/Pleura: Calcified granuloma and scarring are noted in the medial aspect of the right lower lobe. Mild atelectasis or scarring is noted bilaterally. No consolidation, effusion, or pneumothorax. Musculoskeletal: Mild degenerative changes in the thoracic spine. No acute osseous abnormality. Review of the MIP images confirms the above findings. CTA ABDOMEN AND PELVIS FINDINGS VASCULAR Aorta: Aortic atherosclerosis. Normal caliber aorta without aneurysm, dissection, vasculitis or significant stenosis. Celiac: Patent without evidence of aneurysm, dissection, or vasculitis. There is focal narrowing of the proximal celiac artery which may  be due to diaphragmatic position versus median arcuate ligament syndrome. SMA: Patent without evidence of aneurysm, dissection, vasculitis or significant stenosis. Renals: Both renal arteries are patent without evidence of aneurysm, dissection, vasculitis, fibromuscular dysplasia or significant stenosis. An accessory renal artery is noted on the left. IMA: Patent without evidence of aneurysm, dissection, vasculitis or significant stenosis. Inflow: Patent without evidence of aneurysm, dissection, vasculitis or significant stenosis. Veins: No obvious venous abnormality within the limitations of this arterial phase study. Review of the MIP images confirms the above findings. NON-VASCULAR Hepatobiliary: No focal liver abnormality is seen. Multiple stones are present within the gallbladder. No biliary ductal dilatation. Pancreas: Unremarkable. No pancreatic ductal dilatation or surrounding inflammatory changes. Spleen: Normal in size without focal abnormality. Adrenals/Urinary Tract: There is a 2.6 cm nodule in the right adrenal gland with attenuation -4 Hounsfield units, compatible with an adenoma. The left adrenal gland is within normal limits. The kidneys enhance symmetrically. No renal calculus or hydronephrosis. Bladder is unremarkable. Stomach/Bowel: Stomach is within normal limits. The appendix is not visualized on exam and surgical changes are noted at the cecum.  No evidence of bowel wall thickening, distention, or inflammatory changes. No free air or pneumatosis. Lymphatic: No abdominal or pelvic lymphadenopathy. Reproductive: Uterus and bilateral adnexa are unremarkable. Other: Small fat containing umbilical hernia. No abdominopelvic ascites. Musculoskeletal: Degenerative changes are present in the lumbar spine. No acute osseous abnormality. Review of the MIP images confirms the above findings. IMPRESSION: 1. No evidence of aortic aneurysm or dissection. 2. No acute process in the chest, abdomen, or pelvis. 3.  Cholelithiasis. 4. Right adrenal adenoma. Electronically Signed   By: Brett Fairy M.D.   On: 07/17/2021 23:34   US Abdomen Limited RUQ (LIVER/GB)  Result Date: 07/18/2021 CLINICAL DATA:  Pain EXAM: ULTRASOUND ABDOMEN LIMITED RIGHT UPPER QUADRANT COMPARISON:  CT 05/17/2021 FINDINGS: Gallbladder: Multiple gallstones within the gallbladder. 1.9 cm gallstone in the neck. Gallbladder wall is slightly thickened at 4 mm. Negative sonographic Murphy's. Common bile duct: Diameter: Normal caliber, 4 mm Liver: 6 portal vein is patent on color Doppler imaging with normal direction of blood flow towards the liver. Other: None. IMPRESSION: Cholelithiasis. Slight gallbladder wall thickening. Recommend clinical correlation to exclude acute cholecystitis. Electronically Signed   By: Rolm Baptise M.D.   On: 07/18/2021 01:26    Review of Systems  Constitutional:  Negative for appetite change.  HENT: Negative.    Eyes: Negative.   Respiratory: Negative.    Cardiovascular:  Positive for chest pain.  Gastrointestinal:  Positive for abdominal pain, nausea and vomiting.  Endocrine: Negative.   Genitourinary: Negative.   Musculoskeletal: Negative.   Skin: Negative.   Allergic/Immunologic: Negative.   Neurological: Negative.   Hematological: Negative.   Psychiatric/Behavioral: Negative.    Blood pressure (!) 145/55, pulse (!) 58, temperature (!) 97.5 F (36.4 C), temperature source Oral, resp. rate 14, height 5' (1.524 m), weight 71.4 kg, last menstrual period 02/23/1993, SpO2 98 %. Physical Exam Constitutional:      General: She is not in acute distress. HENT:     Head: Normocephalic.     Nose: Nose normal.     Mouth/Throat:     Mouth: Mucous membranes are moist.  Eyes:     General: No scleral icterus.    Pupils: Pupils are equal, round, and reactive to light.  Cardiovascular:     Rate and Rhythm: Normal rate and regular rhythm.     Pulses: Normal pulses.     Heart sounds: Normal heart sounds.   Pulmonary:     Effort: Pulmonary effort is normal.     Breath sounds: Normal breath sounds.  Abdominal:     General: Abdomen is flat. There is no distension.     Palpations: Abdomen is soft.     Tenderness: There is no abdominal tenderness. There is no guarding or rebound.  Musculoskeletal:        General: Normal range of motion.     Cervical back: Neck supple.  Skin:    General: Skin is warm and dry.     Capillary Refill: Capillary refill takes less than 2 seconds.  Neurological:     Mental Status: She is alert and oriented to person, place, and time.  Psychiatric:        Mood and Affect: Mood normal.    Assessment/Plan: Gallstones with mild GB wall thickening - currently nontender, LFTs and lipase WNL. In light of hypertensive emergency will proceed with HIDA scan to further evaluate. I discussed with her and answered her questions.  Zenovia Jarred 07/18/2021, 6:40 AM

## 2021-07-18 NOTE — Consult Note (Signed)
Cardiology Consultation:   Patient ID: Bridget [name] MRN: [id number]; DOB: 1950-11-09  Admit date: 07/17/2021 Date of Consult: 07/18/2021  Primary Care Provider: Jerline Pain, MD Primary Cardiologist: Jerline Pain Primary Electrophysiologist:  None    Patient Profile:   Bridget [name] is a 71 y.o. female with a hx of HTN, hypothyroidism who is being seen today for the evaluation of hypertensive emergency at the request of emergency department.  History of Present Illness:   Bridget [name] is a 71 year old female with a history of hypertension and hypothyroidism who presented today with acute onset of severely elevated blood pressures, chest pain, and vomiting.  Notes at home that she started develop severe centralized chest pain that she describes as a vice grip around her chest.  At that time she took her blood pressure and noted markedly elevated blood pressures.  She was brought in by EMS and given multiple medications with no alleviation of her symptoms.  Her blood pressures remained in the 200s and her chest pain persisted despite nitroglycerin, morphine, and Dilaudid.  She continued to vomit as well despite Zofran x2.  On arrival to the emergency department she had a blood pressure of 213/69 with heart rate in the 40s.  Initial work-up included troponin 13-19.  Creatinine of 1.2.  WBC of 13 K.  Lipase normal.  Hepatic function panel normal.  ECG with left bundle branch block of unclear acuity.  CT chest abdomen pelvis without evidence of aortic aneurysm or dissection.  No acute process in the chest, abdomen or pelvis.  Cardiology was consulted for recommendations for diagnosis and management of chest pain and severe hypertension.  Past Medical History:  Diagnosis Date   Abnormal Pap smear of cervix    Heart murmur    Hypertension    Hypothyroid    Kidney stones    Osteoporosis    & osteopenia    Past Surgical History:  Procedure Laterality Date   APPENDECTOMY      BREAST BIOPSY Left    benign   BREAST REDUCTION SURGERY     CERVIX LESION DESTRUCTION N/A 1970   repeat pap smears all  normal   GANGLION CYST EXCISION  4/12   left foot & left arm   REDUCTION MAMMAPLASTY     small bowel injury     fistula     Home Medications:  Prior to Admission medications   Medication Sig Start Date End Date Taking? Authorizing Provider  acetaminophen (TYLENOL) 500 MG tablet Take 1,000 mg by mouth every 6 (six) hours as needed. For pain    [provider]  ASPIRIN 81 PO Take by mouth.    [provider]  diltiazem (CARDIZEM CD) 180 MG 24 hr capsule Take 180 mg by mouth daily. 09/10/14   [provider]  Docusate Sodium (COLACE PO) Take 100 mg by mouth daily as needed. constipation    [provider]  hydrochlorothiazide (HYDRODIURIL) 25 MG tablet Take 25 mg by mouth every morning. 03/13/19   [provider]  ibuprofen (ADVIL,MOTRIN) 200 MG tablet Take 400 mg by mouth every 6 (six) hours as needed for moderate pain.    [provider]  LORazepam (ATIVAN) 1 MG tablet Take 0.5 mg by mouth every 8 (eight) hours as needed. For anxiety    [provider]  MELATONIN PO Take 5 mg by mouth as needed.     [provider]  Multiple Vitamin (MULTIVITAMIN WITH MINERALS) TABS tablet Take 1  tablet by mouth as needed.     [provider]  SYNTHROID 125 MCG tablet  10/23/17   [provider]    Inpatient Medications: Scheduled Meds:  Continuous Infusions:  PRN Meds:   Allergies:    Allergies  Allergen Reactions   Amlodipine     Other reaction(s): swelling   Beta Adrenergic Blockers     Other reaction(s): fatigue   Codeine Nausea Only   Lisinopril Other (See Comments)    cough   Vicodin [Hydrocodone-Acetaminophen] Other (See Comments)    Made her feel off    Social History:   Social History   Socioeconomic History   Marital status: Married    Spouse name: Not on file    Number of children: Not on file   Years of education: Not on file   Highest education level: Not on file  Occupational History   Not on file  Tobacco Use   Smoking status: Never   Smokeless tobacco: Never  Substance and Sexual Activity   Alcohol use: Yes    Comment: occ   Drug use: No   Sexual activity: Not Currently    Partners: Male    Birth control/protection: Post-menopausal, Other-see comments    Comment: husband vasectomy, older than 19, less than 5  Other Topics Concern   Not on file  Social History Narrative   Not on file   Social Determinants of Health   Financial Resource Strain: Not on file  Food Insecurity: Not on file  Transportation Needs: Not on file  Physical Activity: Not on file  Stress: Not on file  Social Connections: Not on file  Intimate Partner Violence: Not on file    Family History:    Family History  Problem Relation Age of Onset   Breast cancer Mother    Cancer Brother        leukemia   Emphysema Father    Heart attack Father    Other Daughter        rhematoid arthritis     Review of Systems: [y] = yes, '[ ]'$  = no    General: Weight gain '[ ]'$ ; Weight loss '[ ]'$ ; Anorexia '[ ]'$ ; Fatigue '[ ]'$ ; Fever '[ ]'$ ; Chills '[ ]'$ ; Weakness '[ ]'$   Cardiac: Chest pain/pressure [ y]; Resting SOB '[ ]'$ ; Exertional SOB '[ ]'$ ; Orthopnea '[ ]'$ ; Pedal Edema '[ ]'$ ; Palpitations '[ ]'$ ; Syncope '[ ]'$ ; Presyncope '[ ]'$ ; Paroxysmal nocturnal dyspnea'[ ]'$   Pulmonary: Cough '[ ]'$ ; Wheezing'[ ]'$ ; Hemoptysis'[ ]'$ ; Sputum '[ ]'$ ; Snoring '[ ]'$   GI: Vomiting[ y]; Dysphagia'[ ]'$ ; Melena'[ ]'$ ; Hematochezia '[ ]'$ ; Heartburn'[ ]'$ ; Abdominal pain '[ ]'$ ; Constipation '[ ]'$ ; Diarrhea '[ ]'$ ; BRBPR '[ ]'$   GU: Hematuria'[ ]'$ ; Dysuria '[ ]'$ ; Nocturia'[ ]'$   Vascular: Pain in legs with walking '[ ]'$ ; Pain in feet with lying flat '[ ]'$ ; Non-healing sores '[ ]'$ ; Stroke '[ ]'$ ; TIA '[ ]'$ ; Slurred speech '[ ]'$ ;  Neuro: Headaches'[ ]'$ ; Vertigo'[ ]'$ ; Seizures'[ ]'$ ; Paresthesias'[ ]'$ ;Blurred vision '[ ]'$ ; Diplopia '[ ]'$ ; Vision changes '[ ]'$   Ortho/Skin: Arthritis '[ ]'$ ; Joint  pain '[ ]'$ ; Muscle pain '[ ]'$ ; Joint swelling '[ ]'$ ; Back Pain [ y]; Rash '[ ]'$   Psych: Depression'[ ]'$ ; Anxiety'[ ]'$   Heme: Bleeding problems '[ ]'$ ; Clotting disorders '[ ]'$ ; Anemia '[ ]'$   Endocrine: Diabetes '[ ]'$ ; Thyroid dysfunction'[ ]'$   Physical Exam/Data:   Vitals:   07/18/21 0025 07/18/21 0030 07/18/21 0035 07/18/21 0050  BP: (!) 195/63 (!) 211/72 (!) 180/65 Marland Kitchen)  205/61  Pulse: (!) 49 (!) 47 (!) 51 (!) 58  Resp: '14 14 20 19  '$ Temp:      TempSrc:      SpO2: 97% 97% 98% 91%  Weight:      Height:       No intake or output data in the 24 hours ending 07/18/21 0057 Filed Weights   07/17/21 2221  Weight: 73.9 kg   Body mass index is 31.83 kg/m.  General:  mild distress from pain HEENT: normal Lymph: no adenopathy Neck: no JVD Endocrine:  No thryomegaly Vascular: No carotid bruits; FA pulses 2+ bilaterally without bruits  Cardiac:  normal S1, S2; bradycardic and regular; no murmur  Lungs:  clear to auscultation bilaterally, no wheezing, rhonchi or rales  Abd: soft, nontender, no hepatomegaly  Ext: no edema Musculoskeletal:  No deformities, BUE and BLE strength normal and equal Skin: warm and dry  Neuro:  CNs 2-12 intact, no focal abnormalities noted Psych:  Normal affect   EKG:  The EKG was personally reviewed and demonstrates:  sinus brady, LBBB Telemetry:  Telemetry was personally reviewed and demonstrates:  sinus brayd LBBB  Relevant CV Studies: none  Laboratory Data:  Chemistry Recent Labs  Lab 07/17/21 2233 07/17/21 2237  NA 140  --   K 3.4*  --   CL 107  --   CO2 23  --   GLUCOSE 149*  --   BUN 24*  --   CREATININE 1.21* 1.30*  CALCIUM 9.6  --   GFRNONAA 48*  --   ANIONGAP 10  --     Recent Labs  Lab 07/17/21 2340  PROT 6.6  ALBUMIN 3.7  AST 19  ALT 17  ALKPHOS 83  BILITOT 0.7   Hematology Recent Labs  Lab 07/17/21 2233  WBC 13.0*  RBC 4.61  HGB 12.5  HCT 37.4  MCV 81.1  MCH 27.1  MCHC 33.4  RDW 13.5  PLT 294   Cardiac EnzymesNo results for  input(s): TROPONINI in the last 168 hours. No results for input(s): TROPIPOC in the last 168 hours.  BNPNo results for input(s): BNP, PROBNP in the last 168 hours.  DDimer No results for input(s): DDIMER in the last 168 hours.  Radiology/Studies:  DG Chest Portable 1 View  Result Date: 07/17/2021 CLINICAL DATA:  Chest and back pain. EXAM: PORTABLE CHEST 1 VIEW COMPARISON:  Chest x-ray 08/17/2012 FINDINGS: The heart size and mediastinal contours are within normal limits. Both lungs are clear. The visualized skeletal structures are unremarkable. IMPRESSION: No active disease. Electronically Signed   By: Ronney Asters M.D.   On: 07/17/2021 22:39   CT Angio Chest/Abd/Pel for Dissection W and/or Wo Contrast  Result Date: 07/17/2021 CLINICAL DATA:  Acute aortic syndrome suspected, chest pain. EXAM: CT ANGIOGRAPHY CHEST, ABDOMEN AND PELVIS TECHNIQUE: Non-contrast CT of the chest was initially obtained. Multidetector CT imaging through the chest, abdomen and pelvis was performed using the standard protocol during bolus administration of intravenous contrast. Multiplanar reconstructed images and MIPs were obtained and reviewed to evaluate the vascular anatomy. RADIATION DOSE REDUCTION: This exam was performed according to the departmental dose-optimization program which includes automated exposure control, adjustment of the mA and/or kV according to patient size and/or use of iterative reconstruction technique. CONTRAST:  118m OMNIPAQUE IOHEXOL 350 MG/ML SOLN COMPARISON:  10/16/2005. FINDINGS: CTA CHEST FINDINGS Cardiovascular: The heart is normal in size and there is no pericardial effusion. There is mild atherosclerotic calcification of the aorta without evidence of  aneurysm or dissection. The aortic arch has a bovine configuration. The pulmonary trunk is normal in caliber. Mediastinum/Nodes: No enlarged mediastinal, hilar, or axillary lymph nodes. Thyroid gland, trachea, and esophagus demonstrate no  significant findings. Lungs/Pleura: Calcified granuloma and scarring are noted in the medial aspect of the right lower lobe. Mild atelectasis or scarring is noted bilaterally. No consolidation, effusion, or pneumothorax. Musculoskeletal: Mild degenerative changes in the thoracic spine. No acute osseous abnormality. Review of the MIP images confirms the above findings. CTA ABDOMEN AND PELVIS FINDINGS VASCULAR Aorta: Aortic atherosclerosis. Normal caliber aorta without aneurysm, dissection, vasculitis or significant stenosis. Celiac: Patent without evidence of aneurysm, dissection, or vasculitis. There is focal narrowing of the proximal celiac artery which may be due to diaphragmatic position versus median arcuate ligament syndrome. SMA: Patent without evidence of aneurysm, dissection, vasculitis or significant stenosis. Renals: Both renal arteries are patent without evidence of aneurysm, dissection, vasculitis, fibromuscular dysplasia or significant stenosis. An accessory renal artery is noted on the left. IMA: Patent without evidence of aneurysm, dissection, vasculitis or significant stenosis. Inflow: Patent without evidence of aneurysm, dissection, vasculitis or significant stenosis. Veins: No obvious venous abnormality within the limitations of this arterial phase study. Review of the MIP images confirms the above findings. NON-VASCULAR Hepatobiliary: No focal liver abnormality is seen. Multiple stones are present within the gallbladder. No biliary ductal dilatation. Pancreas: Unremarkable. No pancreatic ductal dilatation or surrounding inflammatory changes. Spleen: Normal in size without focal abnormality. Adrenals/Urinary Tract: There is a 2.6 cm nodule in the right adrenal gland with attenuation -4 Hounsfield units, compatible with an adenoma. The left adrenal gland is within normal limits. The kidneys enhance symmetrically. No renal calculus or hydronephrosis. Bladder is unremarkable. Stomach/Bowel: Stomach is  within normal limits. The appendix is not visualized on exam and surgical changes are noted at the cecum. No evidence of bowel wall thickening, distention, or inflammatory changes. No free air or pneumatosis. Lymphatic: No abdominal or pelvic lymphadenopathy. Reproductive: Uterus and bilateral adnexa are unremarkable. Other: Small fat containing umbilical hernia. No abdominopelvic ascites. Musculoskeletal: Degenerative changes are present in the lumbar spine. No acute osseous abnormality. Review of the MIP images confirms the above findings. IMPRESSION: 1. No evidence of aortic aneurysm or dissection. 2. No acute process in the chest, abdomen, or pelvis. 3. Cholelithiasis. 4. Right adrenal adenoma. Electronically Signed   By: Brett Fairy M.D.   On: 07/17/2021 23:34    Assessment and Plan:   # Hypertensive Emergency Markedly elevated blood pressures with bradycardia and vomiting.  No signs of endorgan damage with near normal troponin and creatinine 1.2.  No liver dysfunction.  This does not appear to be an acute coronary syndrome or ischemic chest pain.  No evidence of acute dissection causing her pain.  Would recommend slowly lowering the blood pressure to see if there is some alleviation of her symptoms.  It is unclear why she had acute marked elevation in her blood pressures.  It is also unclear to me why she is having severe vomiting and bradycardia.  These may be unrelated, as the bradycardia could be related to home diltiazem and the vomiting could be related to medicine administration.  However, the combination of severe hypertension, bradycardia, and intractable vomiting makes me worried for a possible intracranial process driving this acute onset.  I would want to rule that out prior to drastically dropping her blood pressures.  Recommend head CT out of an abundance of caution.  Otherwise agree with nicardipine infusion for lowering blood  pressure slowly and ongoing work-up for etiology.  - agree  with nicardipine gtt for BP - would order echo tomorrow for evaluation  - consider head CT (given bradycardia, htn, vomiting) - recommend admission to medicine / medical icu for ongoing workup and management    For questions or updates, please contact [facility] Please consult [email or web address] for contact info under     Signed, Doyne Keel, MD  07/18/2021 12:57 AM

## 2021-07-18 NOTE — ED Notes (Signed)
Ed provider at bedside with pt at this time repeat ekg completed and given to ed provider

## 2021-07-18 NOTE — ED Notes (Signed)
US at bedside at this time 

## 2021-07-18 NOTE — ED Notes (Signed)
US remains at bedside

## 2021-07-18 NOTE — Progress Notes (Signed)
[facility] Progress Note Patient Name: Bridget [name] DOB: 1950/03/20 MRN: [id number]   Date of Service  07/18/2021  HPI/Events of Note  Bedside RN asking for clarification of NS gtt order, it was discontinued earlier today but the order was left on the [facility], patient also asking for home Melatonin and oral Ativan medications.  eICU Interventions  NS gtt discontinued, 1 mg of Melatonin and 0.5 mg of Ativan ordered x 1 as sleep aids.        Frederik Pear 07/18/2021, 10:57 PM

## 2021-07-18 NOTE — Progress Notes (Signed)
[facility] ADULT ICU REPLACEMENT PROTOCOL   The patient does apply for the [facility] Adult ICU Electrolyte Replacment Protocol based on the criteria listed below:   1.Exclusion criteria: TCTS patients, ECMO patients, and Dialysis patients 2. Is GFR >/= 30 ml/min? Yes.    Patient's GFR today is 60 3. Is SCr </= 2? Yes.   Patient's SCr is 1.00 mg/dL 4. Did SCr increase >/= 0.5 in 24 hours? No. 5.Pt's weight >40kg  Yes.   6. Abnormal electrolyte(s): K+ 3.5  7. Electrolytes replaced per protocol 8.  Call MD STAT for K+ </= 2.5, Phos </= 1, or Mag </= 1 Physician:  Randie Heinz 07/18/2021 6:53 AM

## 2021-07-18 NOTE — H&P (Signed)
NAME:  Bridget [name], MRN:  [id number], DOB:  10/06/1950, LOS: 0 ADMISSION DATE:  07/17/2021, CONSULTATION DATE:  07/18/21 REFERRING MD:  EDP, CHIEF COMPLAINT:  CP/hypertension   History of Present Illness:  70 yo female with pmh htn, hypothyroidism compliant with meds at home presented with severe CP from home. Initially starting as indigestion like symptoms not relieved with tums. She noted her BP was in mid 200's.  She then took asa and tylenol and called ems. Ems provided 0.'4mg'$  SL nitro with some improvement in BP from 240's to 190's. However, pt's CP was reportedly exacerbated by nitro.  On presentation her SBP 210, she endorsed feeling like "vice grip" around back without ability to get a full breath. She then began vomiting profusely.  No attempts as medicating the pain have been successful. She underwent ct chest,abd,pelvis to eval for dissection which was thankfully negative.   She was started on zofran/compazine and cardene infusion for BP. At some point in ed pt became bradycardic with vomtiing. Cardiology was consulted as well and does not feel that ACS is etiology of symptoms.   CCM has been asked to admit pt 2/2 cardene infusion. Upon eval pt was vomiting again. So full exam was challenging to complete. Decision was made to obtain cth when able to lay flat and not actively vomiting.   Pertinent  Medical History  Htn hypothyroidism  Significant Hospital Events: Including procedures, antibiotic start and stop dates in addition to other pertinent events   5/26 admitted to ICU for cardene infusion  Interim History / Subjective:  5/26  Objective   Blood pressure (!) 149/55, pulse 64, temperature (!) 97.5 F (36.4 C), temperature source Oral, resp. rate 14, height 5' (1.524 m), weight 71.4 kg, last menstrual period 02/23/1993, SpO2 95 %.        Intake/Output Summary (Last 24 hours) at 07/18/2021 0341 Last data filed at 07/18/2021 0300 Gross per 24 hour  Intake 212.42 ml   Output 200 ml  Net 12.42 ml   Filed Weights   07/17/21 2221 07/18/21 0215  Weight: 73.9 kg 71.4 kg    Examination: General: woman laying on side vomiting appears acutely ill HENT: ncat, eomi, perrla, mmmp Lungs: ctab Cardiovascular: sinus per tele unable to auscultate anteriorly 2/2 positioning Abdomen: denies abdominal pain, BS + Extremities: no c/c/e Neuro: drowsy when not vomiting however no focal deficits appreciated.  GU: deferred  Resolved Hospital Problem list     Assessment & Plan:  Hypertensive emergency:  -titrate cardene gtt to keep BP <180 -would hold on dropping further  -cont home meds when pt able to tolerate po -cth when able to lay flat with her vomitus CP:  -thankfully ct without dissection -appreciate cards input -less likely acs -can try dose of toradol if needed LBBB:  -noted, cards is seeing   N/V: -persistent -?with mildly positive RUQ u/s, lft/bili ok -was given ctx in ed -edp has already communicated with general surgery -cont with compazine -check cth when able to lay flat  Hypothyroidism:  -synthroid compliant at baseline -check tsh   Best Practice (right click and "Reselect all SmartList Selections" daily)   Diet/type: NPO DVT prophylaxis: SCD GI prophylaxis: N/A Lines: N/A Foley:  N/A Code Status:  full code Last date of multidisciplinary goals of care discussion [5/26]  Labs   CBC: Recent Labs  Lab 07/17/21 2233  WBC 13.0*  NEUTROABS 9.6*  HGB 12.5  HCT 37.4  MCV 81.1  PLT 294  Basic Metabolic Panel: Recent Labs  Lab 07/17/21 2233 07/17/21 2237  NA 140  --   K 3.4*  --   CL 107  --   CO2 23  --   GLUCOSE 149*  --   BUN 24*  --   CREATININE 1.21* 1.30*  CALCIUM 9.6  --    GFR: Estimated Creatinine Clearance: 34.5 mL/min (A) (by C-G formula based on SCr of 1.3 mg/dL (H)). Recent Labs  Lab 07/17/21 2233  WBC 13.0*    Liver Function Tests: Recent Labs  Lab 07/17/21 2340  AST 19  ALT 17   ALKPHOS 83  BILITOT 0.7  PROT 6.6  ALBUMIN 3.7   Recent Labs  Lab 07/17/21 2340  LIPASE 46   No results for input(s): AMMONIA in the last 168 hours.  ABG No results found for: PHART, PCO2ART, PO2ART, HCO3, TCO2, ACIDBASEDEF, O2SAT   Coagulation Profile: No results for input(s): INR, PROTIME in the last 168 hours.  Cardiac Enzymes: No results for input(s): CKTOTAL, CKMB, CKMBINDEX, TROPONINI in the last 168 hours.  HbA1C: No results found for: HGBA1C  CBG: No results for input(s): GLUCAP in the last 168 hours.  Review of Systems:   As per HPI  Past Medical History:  She,  has a past medical history of Abnormal Pap smear of cervix, Heart murmur, Hypertension, Hypothyroid, Kidney stones, and Osteoporosis.   Surgical History:   Past Surgical History:  Procedure Laterality Date   APPENDECTOMY     BREAST BIOPSY Left    benign   BREAST REDUCTION SURGERY     CERVIX LESION DESTRUCTION N/A 1970   repeat pap smears all  normal   GANGLION CYST EXCISION  4/12   left foot & left arm   REDUCTION MAMMAPLASTY     small bowel injury     fistula     Social History:   reports that she has never smoked. She has never used smokeless tobacco. She reports current alcohol use. She reports that she does not use drugs.   Family History:  Her family history includes Breast cancer in her mother; Cancer in her brother; Emphysema in her father; Heart attack in her father; Other in her daughter.   Allergies Allergies  Allergen Reactions   Amlodipine     Other reaction(s): swelling   Beta Adrenergic Blockers     Other reaction(s): fatigue   Codeine Nausea Only   Lisinopril Other (See Comments)    cough   Vicodin [Hydrocodone-Acetaminophen] Other (See Comments)    Made her feel off     Home Medications  Prior to Admission medications   Medication Sig Start Date End Date Taking? Authorizing Provider  acetaminophen (TYLENOL) 500 MG tablet Take 1,000 mg by mouth every 6 (six)  hours as needed for moderate pain or headache.   Yes [provider]  aspirin EC 81 MG tablet Take 81 mg by mouth daily.   Yes [provider]  diltiazem (CARDIZEM CD) 180 MG 24 hr capsule Take 180 mg by mouth daily. 09/10/14  Yes [provider]  docusate sodium (COLACE) 100 MG capsule Take 100 mg by mouth daily as needed (constipation).   Yes [provider]  hydrochlorothiazide (HYDRODIURIL) 25 MG tablet Take 25 mg by mouth every morning. 03/13/19  Yes [provider]  ibuprofen (ADVIL,MOTRIN) 200 MG tablet Take 400 mg by mouth every 6 (six) hours as needed for moderate pain.   Yes [provider]  LORazepam (ATIVAN) 1 MG  tablet Take 0.5-1 mg by mouth at bedtime as needed for sleep. For anxiety   Yes [provider]  MELATONIN PO Take 5 mg by mouth at bedtime as needed (sleep).   Yes [provider]  Multiple Vitamins-Minerals (MULTIVITAMIN ADULT) CHEW Chew 2 tablets by mouth daily. gummy   Yes [provider]  oxymetazoline (AFRIN) 0.05 % nasal spray Place 1 spray into both nostrils 2 (two) times daily as needed for congestion.   Yes [provider]  SYNTHROID 125 MCG tablet Take 125 mcg by mouth daily before breakfast. 10/23/17  Yes [provider]     Critical care time: 32 mins from 0100-0700 excluding procedures.

## 2021-07-19 ENCOUNTER — Other Ambulatory Visit: Payer: Self-pay

## 2021-07-19 ENCOUNTER — Encounter (HOSPITAL_COMMUNITY)
Admission: EM | Disposition: A | Discharge status: Home / Self Care | Payer: Self-pay | Source: Home / Self Care | Attending: Family Medicine

## 2021-07-19 ENCOUNTER — Encounter (HOSPITAL_COMMUNITY): Payer: Self-pay | Admitting: Family Medicine

## 2021-07-19 ENCOUNTER — Inpatient Hospital Stay (HOSPITAL_COMMUNITY): Payer: Medicare Other

## 2021-07-19 ENCOUNTER — Inpatient Hospital Stay (HOSPITAL_COMMUNITY): Payer: Medicare Other | Admitting: Certified Registered"

## 2021-07-19 DIAGNOSIS — I161 Hypertensive emergency: Secondary | ICD-10-CM

## 2021-07-19 DIAGNOSIS — K8020 Calculus of gallbladder without cholecystitis without obstruction: Secondary | ICD-10-CM

## 2021-07-19 HISTORY — PX: CHOLECYSTECTOMY: SHX55

## 2021-07-19 LAB — COMPREHENSIVE METABOLIC PANEL
ALT: 18 U/L (ref 0–44)
AST: 23 U/L (ref 15–41)
Albumin: 3.3 g/dL — ABNORMAL LOW (ref 3.5–5.0)
Alkaline Phosphatase: 69 U/L (ref 38–126)
Anion gap: 7 (ref 5–15)
BUN: 14 mg/dL (ref 8–23)
CO2: 28 mmol/L (ref 22–32)
Calcium: 8.5 mg/dL — ABNORMAL LOW (ref 8.9–10.3)
Chloride: 103 mmol/L (ref 98–111)
Creatinine, Ser: 0.92 mg/dL (ref 0.44–1.00)
GFR, Estimated: >60 mL/min (ref >60)
Glucose, Bld: 99 mg/dL (ref 70–99)
Potassium: 3.2 mmol/L — ABNORMAL LOW (ref 3.5–5.1)
Sodium: 138 mmol/L (ref 135–145)
Total Bilirubin: 0.7 mg/dL (ref 0.3–1.2)
Total Protein: 5.9 g/dL — ABNORMAL LOW (ref 6.5–8.1)

## 2021-07-19 LAB — CBC
HCT: 37.2 % (ref 36.0–46.0)
Hemoglobin: 12.4 g/dL (ref 12.0–15.0)
MCH: 27 pg (ref 26.0–34.0)
MCHC: 33.3 g/dL (ref 30.0–36.0)
MCV: 81 fL (ref 80.0–100.0)
Platelets: 267 10*3/uL (ref 150–400)
RBC: 4.59 MIL/uL (ref 3.87–5.11)
RDW: 13.8 % (ref 11.5–15.5)
WBC: 7.7 10*3/uL (ref 4.0–10.5)
nRBC: 0 % (ref 0.0–0.2)

## 2021-07-19 LAB — SURGICAL PCR SCREEN
MRSA, PCR: NEGATIVE
Staphylococcus aureus: POSITIVE — AB

## 2021-07-19 SURGERY — LAPAROSCOPIC CHOLECYSTECTOMY WITH INTRAOPERATIVE CHOLANGIOGRAM
Anesthesia: General | Site: Abdomen

## 2021-07-19 MED ORDER — PROPOFOL 10 MG/ML IV BOLUS
INTRAVENOUS | Status: AC
  Filled 2021-07-19: qty 20

## 2021-07-19 MED ORDER — AMISULPRIDE (ANTIEMETIC) 5 MG/2ML IV SOLN
INTRAVENOUS | Status: AC
  Filled 2021-07-19: qty 4

## 2021-07-19 MED ORDER — BUPIVACAINE-EPINEPHRINE 0.25% -1:200000 IJ SOLN
INTRAMUSCULAR | Status: DC | PRN
  Administered 2021-07-19: 30 mL

## 2021-07-19 MED ORDER — OXYCODONE HCL 5 MG/5ML PO SOLN: 5.0000 mg | Freq: Once | ORAL | Status: DC | PRN

## 2021-07-19 MED ORDER — CHLORHEXIDINE GLUCONATE 0.12 % MT SOLN
15.0000 mL | Freq: Once | OROMUCOSAL | Status: AC
  Filled 2021-07-19: qty 15

## 2021-07-19 MED ORDER — ONDANSETRON HCL 4 MG/2ML IJ SOLN
INTRAMUSCULAR | Status: DC | PRN
  Administered 2021-07-19: 4 mg via INTRAVENOUS

## 2021-07-19 MED ORDER — HYDROMORPHONE HCL 1 MG/ML IJ SOLN: 0.2500 mg | INTRAMUSCULAR | Status: DC | PRN

## 2021-07-19 MED ORDER — KETOROLAC TROMETHAMINE 15 MG/ML IJ SOLN
15.0000 mg | Freq: Four times a day (QID) | INTRAMUSCULAR | Status: DC | PRN
  Administered 2021-07-19: 15 mg via INTRAVENOUS

## 2021-07-19 MED ORDER — CHLORHEXIDINE GLUCONATE 0.12 % MT SOLN
OROMUCOSAL | Status: AC
  Administered 2021-07-19: 15 mL via OROMUCOSAL
  Filled 2021-07-19: qty 15

## 2021-07-19 MED ORDER — AMISULPRIDE (ANTIEMETIC) 5 MG/2ML IV SOLN
10.0000 mg | Freq: Once | INTRAVENOUS | Status: AC
  Administered 2021-07-19: 10 mg via INTRAVENOUS

## 2021-07-19 MED ORDER — DEXAMETHASONE SODIUM PHOSPHATE 10 MG/ML IJ SOLN
INTRAMUSCULAR | Status: DC | PRN
  Administered 2021-07-19: 10 mg via INTRAVENOUS

## 2021-07-19 MED ORDER — ONDANSETRON HCL 4 MG/2ML IJ SOLN: 4.0000 mg | Freq: Four times a day (QID) | INTRAMUSCULAR | Status: DC | PRN

## 2021-07-19 MED ORDER — MUPIROCIN 2 % EX OINT
1.0000 "application " | TOPICAL_OINTMENT | Freq: Two times a day (BID) | CUTANEOUS | Status: DC
  Administered 2021-07-19 – 2021-07-20 (×3): 1 via NASAL
  Filled 2021-07-19: qty 22

## 2021-07-19 MED ORDER — SUGAMMADEX SODIUM 200 MG/2ML IV SOLN
INTRAVENOUS | Status: DC | PRN
Start: 2021-07-19 — End: 2021-07-19
  Administered 2021-07-19: 200 mg via INTRAVENOUS

## 2021-07-19 MED ORDER — DOCUSATE SODIUM 100 MG PO CAPS: 100.0000 mg | ORAL_CAPSULE | Freq: Two times a day (BID) | ORAL | 2 refills | Status: AC

## 2021-07-19 MED ORDER — OXYCODONE HCL 5 MG PO TABS: 5.0000 mg | ORAL_TABLET | Freq: Once | ORAL | Status: DC | PRN

## 2021-07-19 MED ORDER — ROCURONIUM BROMIDE 10 MG/ML (PF) SYRINGE
PREFILLED_SYRINGE | INTRAVENOUS | Status: DC | PRN
  Administered 2021-07-19: 60 mg via INTRAVENOUS
  Administered 2021-07-19: 20 mg via INTRAVENOUS

## 2021-07-19 MED ORDER — BUPIVACAINE-EPINEPHRINE (PF) 0.25% -1:200000 IJ SOLN
INTRAMUSCULAR | Status: AC
  Filled 2021-07-19: qty 30

## 2021-07-19 MED ORDER — IBUPROFEN 600 MG PO TABS: 600.0000 mg | ORAL_TABLET | Freq: Four times a day (QID) | ORAL | 1 refills | Status: AC

## 2021-07-19 MED ORDER — 0.9 % SODIUM CHLORIDE (POUR BTL) OPTIME
TOPICAL | Status: DC | PRN
  Administered 2021-07-19: 1000 mL

## 2021-07-19 MED ORDER — KETOROLAC TROMETHAMINE 30 MG/ML IJ SOLN: 15.0000 mg | Freq: Once | INTRAMUSCULAR | Status: DC | PRN

## 2021-07-19 MED ORDER — ONDANSETRON HCL 4 MG/2ML IJ SOLN
INTRAMUSCULAR | Status: AC
  Filled 2021-07-19: qty 2

## 2021-07-19 MED ORDER — KETOROLAC TROMETHAMINE 15 MG/ML IJ SOLN
INTRAMUSCULAR | Status: AC
  Filled 2021-07-19: qty 1

## 2021-07-19 MED ORDER — DEXAMETHASONE SODIUM PHOSPHATE 10 MG/ML IJ SOLN
INTRAMUSCULAR | Status: AC
  Filled 2021-07-19: qty 1

## 2021-07-19 MED ORDER — LACTATED RINGERS IV SOLN: INTRAVENOUS | Status: DC

## 2021-07-19 MED ORDER — KETOROLAC TROMETHAMINE 15 MG/ML IJ SOLN
15.0000 mg | Freq: Four times a day (QID) | INTRAMUSCULAR | Status: DC
Start: 2021-07-19 — End: 2021-07-20
  Administered 2021-07-19 – 2021-07-20 (×4): 15 mg via INTRAVENOUS
  Filled 2021-07-19 (×4): qty 1

## 2021-07-19 MED ORDER — FENTANYL CITRATE (PF) 250 MCG/5ML IJ SOLN
INTRAMUSCULAR | Status: AC
  Filled 2021-07-19: qty 5

## 2021-07-19 MED ORDER — EPHEDRINE 5 MG/ML INJ
INTRAVENOUS | Status: AC
  Filled 2021-07-19: qty 5

## 2021-07-19 MED ORDER — SODIUM CHLORIDE 0.9 % IV SOLN: 12.5000 mg | Freq: Four times a day (QID) | INTRAVENOUS | Status: DC | PRN

## 2021-07-19 MED ORDER — ROCURONIUM BROMIDE 10 MG/ML (PF) SYRINGE
PREFILLED_SYRINGE | INTRAVENOUS | Status: AC
  Filled 2021-07-19: qty 10

## 2021-07-19 MED ORDER — ACETAMINOPHEN 500 MG PO TABS: 1000.0000 mg | ORAL_TABLET | Freq: Four times a day (QID) | ORAL | 3 refills | Status: AC

## 2021-07-19 MED ORDER — EPHEDRINE SULFATE-NACL 50-0.9 MG/10ML-% IV SOSY
PREFILLED_SYRINGE | INTRAVENOUS | Status: DC | PRN
  Administered 2021-07-19: 5 mg via INTRAVENOUS

## 2021-07-19 MED ORDER — OXYCODONE HCL 5 MG PO TABS: 5.0000 mg | ORAL_TABLET | ORAL | 0 refills | Status: DC | PRN

## 2021-07-19 MED ORDER — ACETAMINOPHEN 500 MG PO TABS
1000.0000 mg | ORAL_TABLET | Freq: Four times a day (QID) | ORAL | Status: DC
  Administered 2021-07-19 – 2021-07-20 (×4): 1000 mg via ORAL
  Filled 2021-07-19 (×4): qty 2

## 2021-07-19 MED ORDER — MORPHINE SULFATE (PF) 2 MG/ML IV SOLN
2.0000 mg | INTRAVENOUS | Status: DC | PRN
  Administered 2021-07-19: 2 mg via INTRAVENOUS
  Filled 2021-07-19: qty 1

## 2021-07-19 MED ORDER — METHOCARBAMOL 750 MG PO TABS: 750.0000 mg | ORAL_TABLET | Freq: Four times a day (QID) | ORAL | 1 refills | Status: DC

## 2021-07-19 MED ORDER — METHOCARBAMOL 500 MG PO TABS
1000.0000 mg | ORAL_TABLET | Freq: Three times a day (TID) | ORAL | Status: DC
Start: 2021-07-19 — End: 2021-07-20
  Administered 2021-07-19 – 2021-07-20 (×3): 1000 mg via ORAL
  Filled 2021-07-19 (×3): qty 2

## 2021-07-19 MED ORDER — LIDOCAINE 2% (20 MG/ML) 5 ML SYRINGE
INTRAMUSCULAR | Status: DC | PRN
  Administered 2021-07-19: 100 mg via INTRAVENOUS

## 2021-07-19 MED ORDER — POTASSIUM CHLORIDE CRYS ER 20 MEQ PO TBCR
40.0000 meq | EXTENDED_RELEASE_TABLET | Freq: Once | ORAL | Status: AC
  Administered 2021-07-19: 40 meq via ORAL
  Filled 2021-07-19: qty 2

## 2021-07-19 MED ORDER — LIDOCAINE 2% (20 MG/ML) 5 ML SYRINGE
INTRAMUSCULAR | Status: AC
  Filled 2021-07-19: qty 5

## 2021-07-19 MED ORDER — POTASSIUM CHLORIDE 20 MEQ PO PACK
40.0000 meq | PACK | Freq: Once | ORAL | Status: DC
  Filled 2021-07-19: qty 2

## 2021-07-19 MED ORDER — FENTANYL CITRATE (PF) 250 MCG/5ML IJ SOLN
INTRAMUSCULAR | Status: DC | PRN
  Administered 2021-07-19 (×3): 25 ug via INTRAVENOUS
  Administered 2021-07-19: 100 ug via INTRAVENOUS

## 2021-07-19 MED ORDER — OXYCODONE HCL 5 MG/5ML PO SOLN: 5.0000 mg | ORAL | Status: DC | PRN

## 2021-07-19 MED ORDER — KETOROLAC TROMETHAMINE 15 MG/ML IJ SOLN: 15.0000 mg | Freq: Four times a day (QID) | INTRAMUSCULAR | Status: DC

## 2021-07-19 MED ORDER — PROPOFOL 10 MG/ML IV BOLUS
INTRAVENOUS | Status: DC | PRN
Start: 2021-07-19 — End: 2021-07-19
  Administered 2021-07-19: 150 mg via INTRAVENOUS

## 2021-07-19 SURGICAL SUPPLY — 46 items
ADH SKN CLS APL DERMABOND .7 (GAUZE/BANDAGES/DRESSINGS) ×1
ADH SKN CLS LQ APL DERMABOND (GAUZE/BANDAGES/DRESSINGS) ×1
APL PRP STRL LF DISP 70% ISPRP (MISCELLANEOUS) ×1
APPLIER CLIP 5 13 M/L LIGAMAX5 (MISCELLANEOUS) ×2
APR CLP MED LRG 5 ANG JAW (MISCELLANEOUS) ×1
BAG COUNTER SPONGE SURGICOUNT (BAG) ×3 IMPLANT
BAG SPEC RTRVL LRG 6X4 10 (ENDOMECHANICALS)
BAG SPNG CNTER NS LX DISP (BAG) ×1
BLADE CLIPPER SURG (BLADE) IMPLANT
CANISTER SUCT 3000ML PPV (MISCELLANEOUS) ×3 IMPLANT
CHLORAPREP W/TINT 26 (MISCELLANEOUS) ×3 IMPLANT
CLIP APPLIE 5 13 M/L LIGAMAX5 (MISCELLANEOUS) ×2 IMPLANT
COVER MAYO STAND STRL (DRAPES) IMPLANT
COVER SURGICAL LIGHT HANDLE (MISCELLANEOUS) ×3 IMPLANT
DERMABOND ADHESIVE PROPEN (GAUZE/BANDAGES/DRESSINGS) ×1
DERMABOND ADVANCED (GAUZE/BANDAGES/DRESSINGS) ×1
DERMABOND ADVANCED .7 DNX12 (GAUZE/BANDAGES/DRESSINGS) ×2 IMPLANT
DERMABOND ADVANCED .7 DNX6 (GAUZE/BANDAGES/DRESSINGS) IMPLANT
DISSECTOR BLUNT TIP ENDO 5MM (MISCELLANEOUS) ×3 IMPLANT
DRAPE C-ARM 42X120 X-RAY (DRAPES) IMPLANT
ELECT REM PT RETURN 9FT ADLT (ELECTROSURGICAL) ×2
ELECTRODE REM PT RTRN 9FT ADLT (ELECTROSURGICAL) ×2 IMPLANT
GLOVE BIO SURGEON STRL SZ 6.5 (GLOVE) ×3 IMPLANT
GLOVE BIOGEL PI IND STRL 6 (GLOVE) ×2 IMPLANT
GLOVE BIOGEL PI INDICATOR 6 (GLOVE) ×1
GOWN STRL REUS W/ TWL LRG LVL3 (GOWN DISPOSABLE) ×6 IMPLANT
GOWN STRL REUS W/TWL LRG LVL3 (GOWN DISPOSABLE) ×6
KIT BASIN OR (CUSTOM PROCEDURE TRAY) ×3 IMPLANT
KIT TURNOVER KIT B (KITS) ×3 IMPLANT
NS IRRIG 1000ML POUR BTL (IV SOLUTION) ×3 IMPLANT
PAD ARMBOARD 7.5X6 YLW CONV (MISCELLANEOUS) ×3 IMPLANT
POUCH SPECIMEN RETRIEVAL 10MM (ENDOMECHANICALS) IMPLANT
SCISSORS LAP 5X35 DISP (ENDOMECHANICALS) ×3 IMPLANT
SET CHOLANGIOGRAPH 5 50 .035 (SET/KITS/TRAYS/PACK) IMPLANT
SET IRRIG TUBING LAPAROSCOPIC (IRRIGATION / IRRIGATOR) IMPLANT
SET TUBE SMOKE EVAC HIGH FLOW (TUBING) ×3 IMPLANT
SLEEVE ENDOPATH XCEL 5M (ENDOMECHANICALS) ×6 IMPLANT
SPECIMEN JAR SMALL (MISCELLANEOUS) ×3 IMPLANT
SUT MNCRL AB 4-0 PS2 18 (SUTURE) ×3 IMPLANT
SUT VICRYL 0 UR6 27IN ABS (SUTURE) IMPLANT
TOWEL GREEN STERILE (TOWEL DISPOSABLE) ×3 IMPLANT
TOWEL GREEN STERILE FF (TOWEL DISPOSABLE) ×3 IMPLANT
TRAY LAPAROSCOPIC MC (CUSTOM PROCEDURE TRAY) ×3 IMPLANT
TROCAR XCEL BLUNT TIP 100MML (ENDOMECHANICALS) ×3 IMPLANT
TROCAR XCEL NON-BLD 5MMX100MML (ENDOMECHANICALS) ×3 IMPLANT
WATER STERILE IRR 1000ML POUR (IV SOLUTION) ×3 IMPLANT

## 2021-07-19 NOTE — Op Note (Signed)
   Operative Note  Date: 07/19/2021  Procedure: laparoscopic cholecystectomy  Pre-op diagnosis: symptomatic cholelithiasis Post-op diagnosis: same  Indication and clinical history: The patient is a 71 y.o. year old female with symptomatic cholelithiasis  Surgeon: Jesusita Oka, MD  Anesthesiologist: Kalman Shan, MD Anesthesia: General  Findings:  Specimen: gallbladder EBL: 10cc Drains/Implants: none  Disposition: PACU - hemodynamically stable.  Description of procedure: The patient was positioned supine on the operating room table. Time-out was performed verifying correct patient, procedure, signature of informed consent, and administration of pre-operative antibiotics. General anesthetic induction and intubation were uneventful. The abdomen was prepped and draped in the usual sterile fashion. An infra-umbilical incision was made using an open technique using zero vicryl stay sutures on either side of the fascia and a 99m Hassan port inserted. After establishing pneumoperitoneum, which the patient tolerated well, the abdominal cavity was inspected and no injury of any intra-abdominal structures was identified. Additional ports were placed under direct visualization and using local anesthetic: two 528mports in the right subcostal region and a 63m37mort in the epigastric region. The patient was re-positioned to reverse Trendelenburg and right side up. Adhesiolysis was performed to expose the gallbladder, which was then retracted cephalad. The infundibulum was identified and retracted toward the right lower quadrant. The peritoneum was incised over the infundibulum and the triangle of Calot dissected to expose the critical view of safety. With clear identification and isolation of the cystic duct and cystic artery, the cystic artery was doubly clipped and divided. After this, the cystic duct was identified as a single structure entering the gallbladder, and was also doubly clipped and divided. The  gallbladder was dissected off the liver bed using electrocautery and hemostasis of the liver bed was confirmed prior to separation of the final peritoneal attachments of the gallbladder to the liver bed. The gallbladder fossa was irrigated and fluid returned clear. After transection of the final peritoneal attachments, the gallbladder was placed in an endoscopic specimen retrieval bag, removed via the umbilical port site, and sent to pathology as a permanent specimen. The gallbladder fossa was inspected confirming hemostasis, the absence of bile leakage from the cystic duct stump, and correct placement of clips on the cystic artery and cystic duct stumps. The abdomen was desufflated and the fascia of the umbilical port site was closed using the previously placed stay sutures. Additional local anesthetic was administered at the umbilical port site.  The skin of all incisions was closed with 4-0 monocryl. Sterile dressings were applied. All sponge and instrument counts were correct at the conclusion of the procedure. The patient was awakened from anesthesia, extubated uneventfully, and transported to the PACU - hemodynamically stable.. There were no complications.     AyeJesusita OkaD General and [facility]

## 2021-07-19 NOTE — Anesthesia Preprocedure Evaluation (Addendum)
Anesthesia Evaluation  Patient identified by MRN, date of birth, ID band Patient awake    Reviewed: Allergy & Precautions, NPO status , Patient's Chart, lab work & pertinent test results  Airway Mallampati: II  TM Distance: >3 FB Neck ROM: Full    Dental no notable dental hx. (+) Dental Advisory Given,    Pulmonary neg pulmonary ROS,    Pulmonary exam normal breath sounds clear to auscultation       Cardiovascular hypertension, Pt. on medications Normal cardiovascular exam Rhythm:Regular Rate:Normal     Neuro/Psych negative neurological ROS  negative psych ROS   GI/Hepatic negative GI ROS, Neg liver ROS,   Endo/Other  Hypothyroidism   Renal/GU negative Renal ROS  negative genitourinary   Musculoskeletal negative musculoskeletal ROS (+)   Abdominal   Peds negative pediatric ROS (+)  Hematology negative hematology ROS (+)   Anesthesia Other Findings   Reproductive/Obstetrics negative OB ROS                            Anesthesia Physical Anesthesia Plan  ASA: 2  Anesthesia Plan: General   Post-op Pain Management: Ofirmev IV (intra-op)*   Induction: Intravenous  PONV Risk Score and Plan: 3 and Ondansetron, Dexamethasone and Treatment may vary due to age or medical condition  Airway Management Planned: Oral ETT  Additional Equipment:   Intra-op Plan:   Post-operative Plan: Extubation in OR  Informed Consent: I have reviewed the patients History and Physical, chart, labs and discussed the procedure including the risks, benefits and alternatives for the proposed anesthesia with the patient or authorized representative who has indicated his/her understanding and acceptance.     Dental advisory given  Plan Discussed with: CRNA and Surgeon  Anesthesia Plan Comments:         Anesthesia Quick Evaluation

## 2021-07-19 NOTE — Transfer of Care (Signed)
Immediate Anesthesia Transfer of Care Note  Patient: Bridget [name]  Procedure(s) Performed: LAPAROSCOPIC CHOLECYSTECTOMY WITH POSSIBLE  INTRAOPERATIVE CHOLANGIOGRAM (Abdomen)  Patient Location: PACU  Anesthesia Type:General  Level of Consciousness: awake, alert  and oriented  Airway & Oxygen Therapy: Patient Spontanous Breathing  Post-op Assessment: Report given to RN and Post -op Vital signs reviewed and stable  Post vital signs: Reviewed and stable  Last Vitals:  Vitals Value Taken Time  BP 149/49 07/19/21 1213  Temp    Pulse 57 07/19/21 1215  Resp 18 07/19/21 1215  SpO2 95 % 07/19/21 1215  Vitals shown include unvalidated device data.  Last Pain:  Vitals:   07/19/21 1013  TempSrc: Oral  PainSc:          Complications: No notable events documented.

## 2021-07-19 NOTE — Progress Notes (Signed)
PROGRESS NOTE    Bridget [name]  GUR:[id number] DOB: Mar 14, 1950 DOA: 07/17/2021  PCP: Jerline Pain, MD   Brief Narrative:  This 71 years old female with PMH significant for hypertension, hypothyroidism, compliant with her home medications presented in the ED with complaints of severe chest pain from home.  Patient reports pain started as feeling like indigestion which does not get improved with Tums.  She also noted her blood pressure was in the 200s . She called EMS.  Patient was given 0.5 sublingual nitro with some improvement in blood pressure but she continues to have chest pain.  On arrival in the ED her blood pressure was 210/110.  Patient also started throwing up.  Patient underwent CT chest abdomen pelvis to evaluate for dissection which was negative.  Patient was started on Cardene infusion for blood pressure control.  She also became bradycardic in the ED so cardiology was consulted.  Cardiology does not feel ACS is the etiology of symptoms.  Blood pressure has improved, Cardene drip was discontinued. She was restarted on home blood pressure medications.  Patient also has right upper quadrant abdominal pain,  underwent HIDA scan which was positive for gallstones with acute cholecystitis.  General surgery consulted.  Patient is undergoing laparoscopic cholecystectomy today.  Assessment & Plan:   Principal Problem:   Hypertensive emergency  Hypertensive emergency: Patient presented with chest pain, nausea,  vomiting, blood pressure in ED 210/110. Patient is started on Cardene drip and admitted in ICU. Blood pressure has improved now, She is started on home blood pressure medications . Cardene drip discontinued.  Blood pressure has improved.  Chest pain: Patient reports severe chest pain associated with high blood pressure. Could be secondary to accelerated hypertension. CTA chest abdomen and pelvis without dissection. Cardiology was consulted does not feel ACS is the etiology of  symptoms. Continue Toradol as needed.  Nausea and vomiting: Right upper quadrant pain: Patient also report intractable nausea and vomiting. General surgery was consulted. HIDA scan consistent with cholelithiasis, acute cholecystitis. Started on ceftriaxone General surgery recommended lap chole. Patient is scheduled to have laparoscopic cholecystectomy today. Continue Zofran as needed for nausea and vomiting  Hypothyroidism: Continue levothyroxine  DVT prophylaxis: Lovenox Code Status: Full code Family Communication: No family at bedside Disposition Plan:   Status is: Inpatient Remains inpatient appropriate because:   Admitted with chest pain with hypertensive emergency, blood pressures controlled now found to have right upper quadrant pain HIDA scan positive for cholecystitis undergoing laparoscopic cholecystectomy today   Consultants:  General surgery, cardiology  Procedures: Laparoscopic cholecystectomy today Antimicrobials: Ceftriaxone.  Subjective: Patient was seen and examined at bedside.  Overnight events noted.  Patient still reports having right upper quadrant pain. She reports nausea and vomiting is improving.  Blood pressure has improved.  Objective: Vitals:   07/19/21 1013 07/19/21 1212 07/19/21 1229 07/19/21 1244  BP: (!) 184/60 (!) 149/49 (!) 182/54 (!) 155/56  Pulse: (!) 55 60 60 (!) 59  Resp: '18 13 19 20  '$ Temp: 98.2 F (36.8 C) 98.2 F (36.8 C)  98.2 F (36.8 C)  TempSrc: Oral     SpO2: 96% 96% 94% 92%  Weight: 72.1 kg     Height: 5' (1.524 m)       Intake/Output Summary (Last 24 hours) at 07/19/2021 1326 Last data filed at 07/18/2021 2030 Gross per 24 hour  Intake 583.39 ml  Output --  Net 583.39 ml   Filed Weights   07/18/21 0215 07/19/21 0554 07/19/21 1013  Weight: 71.4 kg 72.4 kg 72.1 kg   Examination: General exam: Appears comfortable, not in any acute distress. Respiratory system: CTA bilaterally, normal respiratory  effort. Cardiovascular system: S1 & S2 heard, regular rate and rhythm, no murmur. Gastrointestinal system: Abdomen is soft, RUQ tenderness +, bowel sounds present  Central nervous system: Alert and oriented x 3. No focal neurological deficits. Extremities: No edema, no cyanosis, no clubbing. Skin: No rashes, lesions or ulcers Psychiatry: Judgement and insight appear normal. Mood & affect appropriate.     Data Reviewed: I have personally reviewed following labs and imaging studies  CBC: Recent Labs  Lab 07/17/21 2233 07/18/21 0549 07/19/21 0249  WBC 13.0* 11.7* 7.7  NEUTROABS 9.6*  --   --   HGB 12.5 12.5 12.4  HCT 37.4 37.0 37.2  MCV 81.1 79.7* 81.0  PLT 294 285 564   Basic Metabolic Panel: Recent Labs  Lab 07/17/21 2233 07/17/21 2237 07/18/21 0549 07/19/21 0249  NA 140  --  146* 138  K 3.4*  --  3.5 3.2*  CL 107  --  107 103  CO2 23  --  29 28  GLUCOSE 149*  --  147* 99  BUN 24*  --  18 14  CREATININE 1.21* 1.30* 1.00 0.92  CALCIUM 9.6  --  8.9 8.5*  MG  --   --  2.2  --   PHOS  --   --  3.6  --    GFR: Estimated Creatinine Clearance: 49 mL/min (by C-G formula based on SCr of 0.92 mg/dL). Liver Function Tests: Recent Labs  Lab 07/17/21 2340 07/19/21 0249  AST 19 23  ALT 17 18  ALKPHOS 83 69  BILITOT 0.7 0.7  PROT 6.6 5.9*  ALBUMIN 3.7 3.3*   Recent Labs  Lab 07/17/21 2340  LIPASE 46   No results for input(s): AMMONIA in the last 168 hours. Coagulation Profile: No results for input(s): INR, PROTIME in the last 168 hours. Cardiac Enzymes: No results for input(s): CKTOTAL, CKMB, CKMBINDEX, TROPONINI in the last 168 hours. BNP (last 3 results) No results for input(s): PROBNP in the last 8760 hours. HbA1C: No results for input(s): HGBA1C in the last 72 hours. CBG: Recent Labs  Lab 07/18/21 0200 07/18/21 0338 07/18/21 0730  GLUCAP 176* 166* 127*   Lipid Profile: No results for input(s): CHOL, HDL, LDLCALC, TRIG, CHOLHDL, LDLDIRECT in the last  72 hours. Thyroid Function Tests: Recent Labs    07/18/21 0549  TSH 3.248   Anemia Panel: No results for input(s): VITAMINB12, FOLATE, FERRITIN, TIBC, IRON, RETICCTPCT in the last 72 hours. Sepsis Labs: No results for input(s): PROCALCITON, LATICACIDVEN in the last 168 hours.  Recent Results (from the past 240 hour(s))  MRSA Next Gen by PCR, Nasal     Status: None   Collection Time: 07/18/21  2:30 AM   Specimen: Nasal Mucosa; Nasal Swab  Result Value Ref Range Status   MRSA by PCR Next Gen NOT DETECTED NOT DETECTED Final    Comment: (NOTE) The GeneXpert MRSA Assay (FDA approved for NASAL specimens only), is one component of a comprehensive MRSA colonization surveillance program. It is not intended to diagnose MRSA infection nor to guide or monitor treatment for MRSA infections. Test performance is not FDA approved in patients less than 6 years old. Performed at [facility], [place] [address], [place], [place] [address]   Surgical PCR screen     Status: Abnormal   Collection Time: 07/19/21  8:16 AM  Specimen: Nasal Mucosa; Nasal Swab  Result Value Ref Range Status   MRSA, PCR NEGATIVE NEGATIVE Final   Staphylococcus aureus POSITIVE (A) NEGATIVE Final    Comment: (NOTE) The Xpert SA Assay (FDA approved for NASAL specimens in patients 30 years of age and older), is one component of a comprehensive surveillance program. It is not intended to diagnose infection nor to guide or monitor treatment. Performed at [facility], [place] [address] [place], [place] [address]     Radiology Studies: CT HEAD WO CONTRAST (5MM)  Result Date: 07/18/2021 CLINICAL DATA:  Benign intracranial hypertension. Bradycardia with nausea and vomiting EXAM: CT HEAD WITHOUT CONTRAST TECHNIQUE: Contiguous axial images were obtained from the base of the skull through the vertex without intravenous contrast. RADIATION DOSE REDUCTION: This exam was performed according to the departmental  dose-optimization program which includes automated exposure control, adjustment of the mA and/or kV according to patient size and/or use of iterative reconstruction technique. COMPARISON:  None Available. FINDINGS: Recent contrast enhanced scan. Brain: No evidence of acute infarction, hemorrhage, hydrocephalus, extra-axial collection or mass lesion/mass effect. Age normal brain volume. Minimal white matter low-density attributed to chronic microvascular ischemia. Vascular: No hyperdense vessel or unexpected calcification. Skull: Normal. Negative for fracture or focal lesion. Sinuses/Orbits: No acute finding. IMPRESSION: Negative head CT. Electronically Signed   By: Jorje Guild M.D.   On: 07/18/2021 04:33   NM Hepatobiliary Liver Func  Result Date: 07/18/2021 CLINICAL DATA:  Gallbladder stones, epigastric pain EXAM: NUCLEAR MEDICINE HEPATOBILIARY IMAGING TECHNIQUE: Sequential images of the abdomen were obtained out to 60 minutes following intravenous administration of radiopharmaceutical. RADIOPHARMACEUTICALS:  5.1 mCi Tc-56m Choletec IV COMPARISON:  Sonogram done earlier today FINDINGS: There is prompt excretion of tracer into the bile ducts. There is prompt entry of tracer into duodenum. There is no activity in the region of the gallbladder fossa. IMPRESSION: There is no demonstrable activity in the gallbladder fossa suggesting possible cystic duct obstruction and acute cholecystitis. Common bile duct appears patent. Electronically Signed   By: PElmer PickerM.D.   On: 07/18/2021 15:49   DG Chest Portable 1 View  Result Date: 07/17/2021 CLINICAL DATA:  Chest and back pain. EXAM: PORTABLE CHEST 1 VIEW COMPARISON:  Chest x-ray 08/17/2012 FINDINGS: The heart size and mediastinal contours are within normal limits. Both lungs are clear. The visualized skeletal structures are unremarkable. IMPRESSION: No active disease. Electronically Signed   By: ARonney AstersM.D.   On: 07/17/2021 22:39    ECHOCARDIOGRAM COMPLETE  Result Date: 07/18/2021    ECHOCARDIOGRAM REPORT   Patient Name:   PRHYLIE STEHRDate of Exam: 07/18/2021 Medical Rec #:  [id number]      Height:       60.0 in Accession #:    [id number]     Weight:       157.4 lb Date of Birth:  1May 13, 1952     BSA:          1.686 m Patient Age:    89years        BP:           139/52 mmHg Patient Gender: F               HR:           60 bpm. Exam Location:  Inpatient Procedure: 2D Echo, Cardiac Doppler, Color Doppler and Intracardiac            Opacification Agent  MODIFIED REPORT: This report was modified by Cherlynn Kaiser MD on 07/18/2021 due to no revision                                      made.  Indications:     Chest pain  History:         Patient has no prior history of Echocardiogram examinations.                  Risk Factors:Hypertension.  Sonographer:     Clayton Lefort RDCS (AE) Referring Phys:  [id number] Ronks MARSHALL Diagnosing Phys: Cherlynn Kaiser MD  Sonographer Comments: Technically difficult study due to poor echo windows. Poor right side windows IMPRESSIONS  1. Left ventricular ejection fraction, by estimation, is 60 to 65%. The left ventricle has normal function. The left ventricle has no regional wall motion abnormalities. There is mild concentric left ventricular hypertrophy. Left ventricular diastolic parameters are consistent with Grade I diastolic dysfunction (impaired relaxation). Abnormal septal motion may be secondary to left bundle branch block.  2. Right ventricular systolic function is normal. The right ventricular size is normal. Tricuspid regurgitation signal is inadequate for assessing PA pressure.  3. The mitral valve is normal in structure. No evidence of mitral valve regurgitation. No evidence of mitral stenosis.  4. The aortic valve is grossly normal. There is mild calcification of the aortic valve. Aortic valve regurgitation is not visualized. Aortic valve sclerosis/calcification  is present, without any evidence of aortic stenosis.  5. The inferior vena cava is normal in size with greater than 50% respiratory variability, suggesting right atrial pressure of 3 mmHg. FINDINGS  Left Ventricle: Left ventricular ejection fraction, by estimation, is 60 to 65%. The left ventricle has normal function. The left ventricle has no regional wall motion abnormalities. Definity contrast agent was given IV to delineate the left ventricular  endocardial borders. The left ventricular internal cavity size was normal in size. There is mild concentric left ventricular hypertrophy. Abnormal (paradoxical) septal motion, consistent with left bundle branch block. Left ventricular diastolic parameters are consistent with Grade I diastolic dysfunction (impaired relaxation). Right Ventricle: The right ventricular size is normal. No increase in right ventricular wall thickness. Right ventricular systolic function is normal. Tricuspid regurgitation signal is inadequate for assessing PA pressure. Left Atrium: Left atrial size was normal in size. Right Atrium: Right atrial size was normal in size. Pericardium: Trivial pericardial effusion is present. Mitral Valve: The mitral valve is normal in structure. No evidence of mitral valve regurgitation. No evidence of mitral valve stenosis. MV peak gradient, 5.7 mmHg. The mean mitral valve gradient is 2.0 mmHg. Tricuspid Valve: The tricuspid valve is normal in structure. Tricuspid valve regurgitation is trivial. No evidence of tricuspid stenosis. Aortic Valve: The aortic valve is grossly normal. There is mild calcification of the aortic valve. Aortic valve regurgitation is not visualized. Aortic valve sclerosis/calcification is present, without any evidence of aortic stenosis. Aortic valve mean gradient measures 5.0 mmHg. Aortic valve peak gradient measures 9.4 mmHg. Aortic valve area, by VTI measures 1.83 cm. Pulmonic Valve: The pulmonic valve was not well visualized. Pulmonic  valve regurgitation is trivial. No evidence of pulmonic stenosis. Aorta: The aortic root is normal in size and structure. Venous: The inferior vena cava is normal in size with greater than 50% respiratory variability, suggesting right atrial pressure of 3 mmHg. IAS/Shunts: The atrial septum is grossly normal.  LEFT  VENTRICLE PLAX 2D LVIDd:         3.90 cm   Diastology LVIDs:         2.40 cm   LV e' medial:    6.96 cm/s LV PW:         1.20 cm   LV E/e' medial:  11.8 LV IVS:        1.20 cm   LV e' lateral:   5.87 cm/s LVOT diam:     1.90 cm   LV E/e' lateral: 14.0 LV SV:         68 LV SV Index:   40 LVOT Area:     2.84 cm  RIGHT VENTRICLE             IVC RV S prime:     12.90 cm/s  IVC diam: 1.70 cm LEFT ATRIUM             Index LA diam:        2.60 cm 1.54 cm/m LA Vol (A2C):   25.8 ml 15.30 ml/m LA Vol (A4C):   42.0 ml 24.91 ml/m LA Biplane Vol: 33.7 ml 19.99 ml/m  AORTIC VALVE AV Area (Vmax):    1.84 cm AV Area (Vmean):   1.90 cm AV Area (VTI):     1.83 cm AV Vmax:           153.00 cm/s AV Vmean:          103.000 cm/s AV VTI:            0.371 m AV Peak Grad:      9.4 mmHg AV Mean Grad:      5.0 mmHg LVOT Vmax:         99.20 cm/s LVOT Vmean:        69.200 cm/s LVOT VTI:          0.240 m LVOT/AV VTI ratio: 0.65  AORTA Ao Root diam: 2.90 cm Ao Asc diam:  2.60 cm MITRAL VALVE MV Area (PHT): 2.83 cm     SHUNTS MV Area VTI:   2.39 cm     Systemic VTI:  0.24 m MV Peak grad:  5.7 mmHg     Systemic Diam: 1.90 cm MV Mean grad:  2.0 mmHg MV Vmax:       1.19 m/s MV Vmean:      66.0 cm/s MV Decel Time: 268 msec MV E velocity: 82.30 cm/s MV A velocity: 122.00 cm/s MV E/A ratio:  0.67 Cherlynn Kaiser MD Electronically signed by Cherlynn Kaiser MD Signature Date/Time: 07/18/2021/10:24:24 AM    Final (Updated)    CT Angio Chest/Abd/Pel for Dissection W and/or Wo Contrast  Result Date: 07/17/2021 CLINICAL DATA:  Acute aortic syndrome suspected, chest pain. EXAM: CT ANGIOGRAPHY CHEST, ABDOMEN AND PELVIS TECHNIQUE:  Non-contrast CT of the chest was initially obtained. Multidetector CT imaging through the chest, abdomen and pelvis was performed using the standard protocol during bolus administration of intravenous contrast. Multiplanar reconstructed images and MIPs were obtained and reviewed to evaluate the vascular anatomy. RADIATION DOSE REDUCTION: This exam was performed according to the departmental dose-optimization program which includes automated exposure control, adjustment of the mA and/or kV according to patient size and/or use of iterative reconstruction technique. CONTRAST:  134m OMNIPAQUE IOHEXOL 350 MG/ML SOLN COMPARISON:  10/16/2005. FINDINGS: CTA CHEST FINDINGS Cardiovascular: The heart is normal in size and there is no pericardial effusion. There is mild atherosclerotic calcification of the aorta without evidence of aneurysm or dissection. The  aortic arch has a bovine configuration. The pulmonary trunk is normal in caliber. Mediastinum/Nodes: No enlarged mediastinal, hilar, or axillary lymph nodes. Thyroid gland, trachea, and esophagus demonstrate no significant findings. Lungs/Pleura: Calcified granuloma and scarring are noted in the medial aspect of the right lower lobe. Mild atelectasis or scarring is noted bilaterally. No consolidation, effusion, or pneumothorax. Musculoskeletal: Mild degenerative changes in the thoracic spine. No acute osseous abnormality. Review of the MIP images confirms the above findings. CTA ABDOMEN AND PELVIS FINDINGS VASCULAR Aorta: Aortic atherosclerosis. Normal caliber aorta without aneurysm, dissection, vasculitis or significant stenosis. Celiac: Patent without evidence of aneurysm, dissection, or vasculitis. There is focal narrowing of the proximal celiac artery which may be due to diaphragmatic position versus median arcuate ligament syndrome. SMA: Patent without evidence of aneurysm, dissection, vasculitis or significant stenosis. Renals: Both renal arteries are patent without  evidence of aneurysm, dissection, vasculitis, fibromuscular dysplasia or significant stenosis. An accessory renal artery is noted on the left. IMA: Patent without evidence of aneurysm, dissection, vasculitis or significant stenosis. Inflow: Patent without evidence of aneurysm, dissection, vasculitis or significant stenosis. Veins: No obvious venous abnormality within the limitations of this arterial phase study. Review of the MIP images confirms the above findings. NON-VASCULAR Hepatobiliary: No focal liver abnormality is seen. Multiple stones are present within the gallbladder. No biliary ductal dilatation. Pancreas: Unremarkable. No pancreatic ductal dilatation or surrounding inflammatory changes. Spleen: Normal in size without focal abnormality. Adrenals/Urinary Tract: There is a 2.6 cm nodule in the right adrenal gland with attenuation -4 Hounsfield units, compatible with an adenoma. The left adrenal gland is within normal limits. The kidneys enhance symmetrically. No renal calculus or hydronephrosis. Bladder is unremarkable. Stomach/Bowel: Stomach is within normal limits. The appendix is not visualized on exam and surgical changes are noted at the cecum. No evidence of bowel wall thickening, distention, or inflammatory changes. No free air or pneumatosis. Lymphatic: No abdominal or pelvic lymphadenopathy. Reproductive: Uterus and bilateral adnexa are unremarkable. Other: Small fat containing umbilical hernia. No abdominopelvic ascites. Musculoskeletal: Degenerative changes are present in the lumbar spine. No acute osseous abnormality. Review of the MIP images confirms the above findings. IMPRESSION: 1. No evidence of aortic aneurysm or dissection. 2. No acute process in the chest, abdomen, or pelvis. 3. Cholelithiasis. 4. Right adrenal adenoma. Electronically Signed   By: Brett Fairy M.D.   On: 07/17/2021 23:34   US Abdomen Limited RUQ (LIVER/GB)  Result Date: 07/18/2021 CLINICAL DATA:  Pain EXAM:  ULTRASOUND ABDOMEN LIMITED RIGHT UPPER QUADRANT COMPARISON:  CT 05/17/2021 FINDINGS: Gallbladder: Multiple gallstones within the gallbladder. 1.9 cm gallstone in the neck. Gallbladder wall is slightly thickened at 4 mm. Negative sonographic Murphy's. Common bile duct: Diameter: Normal caliber, 4 mm Liver: 6 portal vein is patent on color Doppler imaging with normal direction of blood flow towards the liver. Other: None. IMPRESSION: Cholelithiasis. Slight gallbladder wall thickening. Recommend clinical correlation to exclude acute cholecystitis. Electronically Signed   By: Rolm Baptise M.D.   On: 07/18/2021 01:26    Scheduled Meds:  amisulpride       aspirin EC  81 mg Oral Daily   diltiazem  180 mg Oral Daily   hydrochlorothiazide  25 mg Oral q morning   levothyroxine  125 mcg Oral QAC breakfast   mouth rinse  15 mL Mouth Rinse BID   melatonin  1.5 mg Oral Once   mupirocin ointment  1 application. Nasal BID   Continuous Infusions:  cefTRIAXone (ROCEPHIN)  IV 2 g (07/19/21  3888)     LOS: 1 day    Time spent: 50 mins    Shawna Clamp, MD [facility]   If 7PM-7AM, please contact night-coverage

## 2021-07-19 NOTE — Anesthesia Postprocedure Evaluation (Signed)
Anesthesia Post Note  Patient: Bridget [name]  Procedure(s) Performed: LAPAROSCOPIC CHOLECYSTECTOMY WITH POSSIBLE  INTRAOPERATIVE CHOLANGIOGRAM (Abdomen)     Patient location during evaluation: PACU Anesthesia Type: General Level of consciousness: awake and alert Pain management: pain level controlled Vital Signs Assessment: post-procedure vital signs reviewed and stable Respiratory status: spontaneous breathing, nonlabored ventilation, respiratory function stable and patient connected to nasal cannula oxygen Cardiovascular status: blood pressure returned to baseline and stable Postop Assessment: no apparent nausea or vomiting Anesthetic complications: no   No notable events documented.  Last Vitals:  Vitals:   07/19/21 1013 07/19/21 1212  BP: (!) 184/60 (!) 149/49  Pulse: (!) 55 60  Resp: 18 13  Temp: 36.8 C 36.8 C  SpO2: 96% 96%    Last Pain:  Vitals:   07/19/21 1212  TempSrc:   PainSc: Asleep                 Tecora Eustache S

## 2021-07-19 NOTE — Anesthesia Procedure Notes (Signed)
Procedure Name: Intubation Date/Time: 07/19/2021 11:05 AM Performed by: Griffin Dakin, CRNA Pre-anesthesia Checklist: Patient identified, Emergency Drugs available, Suction available and Patient being monitored Patient Re-evaluated:Patient Re-evaluated prior to induction Oxygen Delivery Method: Circle system utilized Preoxygenation: Pre-oxygenation with 100% oxygen Induction Type: IV induction Ventilation: Mask ventilation without difficulty Laryngoscope Size: Mac and 3 Grade View: Grade II Tube type: Oral Tube size: 7.0 mm Number of attempts: 1 Airway Equipment and Method: Stylet and Oral airway Placement Confirmation: ETT inserted through vocal cords under direct vision, positive ETCO2 and breath sounds checked- equal and bilateral Secured at: 23 cm Tube secured with: Tape Dental Injury: Teeth and Oropharynx as per pre-operative assessment

## 2021-07-19 NOTE — Progress Notes (Signed)
   Trauma/Critical Care Follow Up Note  Subjective:    Overnight Issues:   Objective:  Vital signs for last 24 hours: Temp:  [98.4 F (36.9 C)-99 F (37.2 C)] 98.4 F (36.9 C) (05/27 0554) Pulse Rate:  [52-61] 56 (05/27 0554) Resp:  [13-17] 17 (05/27 0554) BP: (136-156)/(48-61) 156/48 (05/26 2355) SpO2:  [91 %-98 %] 98 % (05/27 0554) Weight:  [72.4 kg] 72.4 kg (05/27 0554)  Hemodynamic parameters for last 24 hours:    Intake/Output from previous day: 05/26 0701 - 05/27 0700 In: 353.3 [I.V.:153.3; IV Piggyback:200] Out: -   Intake/Output this shift: No intake/output data recorded.  Vent settings for last 24 hours:    Physical Exam:  Gen: comfortable, no distress Neuro: non-focal exam HEENT: PERRL Neck: supple CV: RRR Pulm: unlabored breathing Abd: soft, NT GU: clear yellow urine Extr: wwp, no edema   Results for orders placed or performed during the hospital encounter of 07/17/21 (from the past 24 hour(s))  Comprehensive metabolic panel     Status: Abnormal   Collection Time: 07/19/21  2:49 AM  Result Value Ref Range   Sodium 138 135 - 145 mmol/L   Potassium 3.2 (L) 3.5 - 5.1 mmol/L   Chloride 103 98 - 111 mmol/L   CO2 28 22 - 32 mmol/L   Glucose, Bld 99 70 - 99 mg/dL   BUN 14 8 - 23 mg/dL   Creatinine, Ser 0.92 0.44 - 1.00 mg/dL   Calcium 8.5 (L) 8.9 - 10.3 mg/dL   Total Protein 5.9 (L) 6.5 - 8.1 g/dL   Albumin 3.3 (L) 3.5 - 5.0 g/dL   AST 23 15 - 41 U/L   ALT 18 0 - 44 U/L   Alkaline Phosphatase 69 38 - 126 U/L   Total Bilirubin 0.7 0.3 - 1.2 mg/dL   GFR, Estimated >60 >60 mL/min   Anion gap 7 5 - 15  CBC     Status: None   Collection Time: 07/19/21  2:49 AM  Result Value Ref Range   WBC 7.7 4.0 - 10.5 K/uL   RBC 4.59 3.87 - 5.11 MIL/uL   Hemoglobin 12.4 12.0 - 15.0 g/dL   HCT 37.2 36.0 - 46.0 %   MCV 81.0 80.0 - 100.0 fL   MCH 27.0 26.0 - 34.0 pg   MCHC 33.3 30.0 - 36.0 g/dL   RDW 13.8 11.5 - 15.5 %   Platelets 267 150 - 400 K/uL   nRBC  0.0 0.0 - 0.2 %    Assessment & Plan:  Present on Admission:  Hypertensive emergency    LOS: 1 day   Additional comments:I reviewed the patient's new clinical lab test results.   and I reviewed the patients new imaging test results.    Symptomatic cholelithiasis - plan for lap chole today. Informed consent was obtained after detailed explanation of risks, including bleeding, infection, biloma, hematoma, injury to common bile duct, need for IOC to delineate anatomy, and need for conversion to open procedure. All questions answered to the patient's satisfaction. FEN - NPO since MN DVT - SCDs Dispo - med surg   Jesusita Oka, MD Trauma & General Surgery Please use [email or web address] to contact on call provider  07/19/2021  *Care during the described time interval was provided by me. I have reviewed this patient's available data, including medical history, events of note, physical examination and test results as part of my evaluation.

## 2021-07-20 ENCOUNTER — Encounter (HOSPITAL_COMMUNITY): Payer: Self-pay | Admitting: Surgery

## 2021-07-20 DIAGNOSIS — I161 Hypertensive emergency: Secondary | ICD-10-CM | POA: Diagnosis not present

## 2021-07-20 LAB — CBC
HCT: 40.2 % (ref 36.0–46.0)
Hemoglobin: 13.7 g/dL (ref 12.0–15.0)
MCH: 27.3 pg (ref 26.0–34.0)
MCHC: 34.1 g/dL (ref 30.0–36.0)
MCV: 80.1 fL (ref 80.0–100.0)
Platelets: 331 10*3/uL (ref 150–400)
RBC: 5.02 MIL/uL (ref 3.87–5.11)
RDW: 13.5 % (ref 11.5–15.5)
WBC: 13.7 10*3/uL — ABNORMAL HIGH (ref 4.0–10.5)
nRBC: 0 % (ref 0.0–0.2)

## 2021-07-20 LAB — COMPREHENSIVE METABOLIC PANEL
ALT: 37 U/L (ref 0–44)
AST: 39 U/L (ref 15–41)
Albumin: 3.7 g/dL (ref 3.5–5.0)
Alkaline Phosphatase: 79 U/L (ref 38–126)
Anion gap: 10 (ref 5–15)
BUN: 17 mg/dL (ref 8–23)
CO2: 24 mmol/L (ref 22–32)
Calcium: 9.3 mg/dL (ref 8.9–10.3)
Chloride: 106 mmol/L (ref 98–111)
Creatinine, Ser: 1 mg/dL (ref 0.44–1.00)
GFR, Estimated: 60 mL/min — ABNORMAL LOW (ref >60)
Glucose, Bld: 199 mg/dL — ABNORMAL HIGH (ref 70–99)
Potassium: 4 mmol/L (ref 3.5–5.1)
Sodium: 140 mmol/L (ref 135–145)
Total Bilirubin: 0.8 mg/dL (ref 0.3–1.2)
Total Protein: 6.7 g/dL (ref 6.5–8.1)

## 2021-07-20 LAB — MAGNESIUM: Magnesium: 2.5 mg/dL — ABNORMAL HIGH (ref 1.7–2.4)

## 2021-07-20 LAB — PHOSPHORUS: Phosphorus: 2.8 mg/dL (ref 2.5–4.6)

## 2021-07-20 MED ORDER — HYDRALAZINE HCL 25 MG PO TABS: 25.0000 mg | ORAL_TABLET | Freq: Three times a day (TID) | ORAL | 0 refills | Status: DC

## 2021-07-20 MED ORDER — HYDRALAZINE HCL 20 MG/ML IJ SOLN
10.0000 mg | INTRAMUSCULAR | Status: DC | PRN
  Administered 2021-07-20: 10 mg via INTRAVENOUS
  Filled 2021-07-20: qty 1

## 2021-07-20 MED ORDER — HYDRALAZINE HCL 25 MG PO TABS
25.0000 mg | ORAL_TABLET | Freq: Three times a day (TID) | ORAL | Status: DC
  Administered 2021-07-20: 25 mg via ORAL
  Filled 2021-07-20: qty 1

## 2021-07-20 NOTE — Progress Notes (Signed)
1 Day Post-Op   Subjective/Chief Complaint: Doing well, tol diet, some shoulder pain, voiding, ambulating   Objective: Vital signs in last 24 hours: Temp:  [98.2 F (36.8 C)-98.4 F (36.9 C)] 98.4 F (36.9 C) (05/28 0523) Pulse Rate:  [54-64] 56 (05/28 0646) Resp:  [13-20] 16 (05/28 0646) BP: (143-204)/(49-66) 193/62 (05/28 0646) SpO2:  [91 %-96 %] 95 % (05/28 0523) Weight:  [72.1 kg-72.4 kg] 72.4 kg (05/28 0523) Last BM Date : 07/17/21  Intake/Output from previous day: 05/27 0701 - 05/28 0700 In: 100 [IV Piggyback:100] Out: -  Intake/Output this shift: No intake/output data recorded.  GI: soft incisoins without infection ,umbilicus with ecchymosis, approp tender  Lab Results:  Recent Labs    07/19/21 0249 07/20/21 0305  WBC 7.7 13.7*  HGB 12.4 13.7  HCT 37.2 40.2  PLT 267 331   BMET Recent Labs    07/19/21 0249 07/20/21 0305  NA 138 140  K 3.2* 4.0  CL 103 106  CO2 28 24  GLUCOSE 99 199*  BUN 14 17  CREATININE 0.92 1.00  CALCIUM 8.5* 9.3   PT/INR No results for input(s): LABPROT, INR in the last 72 hours. ABG No results for input(s): PHART, HCO3 in the last 72 hours.  Invalid input(s): PCO2, PO2  Studies/Results: NM Hepatobiliary Liver Func  Result Date: 07/18/2021 CLINICAL DATA:  Gallbladder stones, epigastric pain EXAM: NUCLEAR MEDICINE HEPATOBILIARY IMAGING TECHNIQUE: Sequential images of the abdomen were obtained out to 60 minutes following intravenous administration of radiopharmaceutical. RADIOPHARMACEUTICALS:  5.1 mCi Tc-42m Choletec IV COMPARISON:  Sonogram done earlier today FINDINGS: There is prompt excretion of tracer into the bile ducts. There is prompt entry of tracer into duodenum. There is no activity in the region of the gallbladder fossa. IMPRESSION: There is no demonstrable activity in the gallbladder fossa suggesting possible cystic duct obstruction and acute cholecystitis. Common bile duct appears patent. Electronically Signed   By:  PElmer PickerM.D.   On: 07/18/2021 15:49   ECHOCARDIOGRAM COMPLETE  Result Date: 07/18/2021    ECHOCARDIOGRAM REPORT   Patient Name:   Bridget FOLTSDate of Exam: 07/18/2021 Medical Rec #:  [id number]      Height:       60.0 in Accession #:    [id number]     Weight:       157.4 lb Date of Birth:  1August 28, 1952     BSA:          1.686 m Patient Age:    89years        BP:           139/52 mmHg Patient Gender: F               HR:           60 bpm. Exam Location:  Inpatient Procedure: 2D Echo, Cardiac Doppler, Color Doppler and Intracardiac            Opacification Agent                                MODIFIED REPORT: This report was modified by GCherlynn KaiserMD on 07/18/2021 due to no revision                                      made.  Indications:  Chest pain  History:         Patient has no prior history of Echocardiogram examinations.                  Risk Factors:Hypertension.  Sonographer:     Clayton Lefort RDCS (AE) Referring Phys:  [id number] Gregg MARSHALL Diagnosing Phys: Cherlynn Kaiser MD  Sonographer Comments: Technically difficult study due to poor echo windows. Poor right side windows IMPRESSIONS  1. Left ventricular ejection fraction, by estimation, is 60 to 65%. The left ventricle has normal function. The left ventricle has no regional wall motion abnormalities. There is mild concentric left ventricular hypertrophy. Left ventricular diastolic parameters are consistent with Grade I diastolic dysfunction (impaired relaxation). Abnormal septal motion may be secondary to left bundle branch block.  2. Right ventricular systolic function is normal. The right ventricular size is normal. Tricuspid regurgitation signal is inadequate for assessing PA pressure.  3. The mitral valve is normal in structure. No evidence of mitral valve regurgitation. No evidence of mitral stenosis.  4. The aortic valve is grossly normal. There is mild calcification of the aortic valve. Aortic valve regurgitation is not  visualized. Aortic valve sclerosis/calcification is present, without any evidence of aortic stenosis.  5. The inferior vena cava is normal in size with greater than 50% respiratory variability, suggesting right atrial pressure of 3 mmHg. FINDINGS  Left Ventricle: Left ventricular ejection fraction, by estimation, is 60 to 65%. The left ventricle has normal function. The left ventricle has no regional wall motion abnormalities. Definity contrast agent was given IV to delineate the left ventricular  endocardial borders. The left ventricular internal cavity size was normal in size. There is mild concentric left ventricular hypertrophy. Abnormal (paradoxical) septal motion, consistent with left bundle branch block. Left ventricular diastolic parameters are consistent with Grade I diastolic dysfunction (impaired relaxation). Right Ventricle: The right ventricular size is normal. No increase in right ventricular wall thickness. Right ventricular systolic function is normal. Tricuspid regurgitation signal is inadequate for assessing PA pressure. Left Atrium: Left atrial size was normal in size. Right Atrium: Right atrial size was normal in size. Pericardium: Trivial pericardial effusion is present. Mitral Valve: The mitral valve is normal in structure. No evidence of mitral valve regurgitation. No evidence of mitral valve stenosis. MV peak gradient, 5.7 mmHg. The mean mitral valve gradient is 2.0 mmHg. Tricuspid Valve: The tricuspid valve is normal in structure. Tricuspid valve regurgitation is trivial. No evidence of tricuspid stenosis. Aortic Valve: The aortic valve is grossly normal. There is mild calcification of the aortic valve. Aortic valve regurgitation is not visualized. Aortic valve sclerosis/calcification is present, without any evidence of aortic stenosis. Aortic valve mean gradient measures 5.0 mmHg. Aortic valve peak gradient measures 9.4 mmHg. Aortic valve area, by VTI measures 1.83 cm. Pulmonic Valve: The  pulmonic valve was not well visualized. Pulmonic valve regurgitation is trivial. No evidence of pulmonic stenosis. Aorta: The aortic root is normal in size and structure. Venous: The inferior vena cava is normal in size with greater than 50% respiratory variability, suggesting right atrial pressure of 3 mmHg. IAS/Shunts: The atrial septum is grossly normal.  LEFT VENTRICLE PLAX 2D LVIDd:         3.90 cm   Diastology LVIDs:         2.40 cm   LV e' medial:    6.96 cm/s LV PW:         1.20 cm   LV E/e' medial:  11.8 LV IVS:  1.20 cm   LV e' lateral:   5.87 cm/s LVOT diam:     1.90 cm   LV E/e' lateral: 14.0 LV SV:         68 LV SV Index:   40 LVOT Area:     2.84 cm  RIGHT VENTRICLE             IVC RV S prime:     12.90 cm/s  IVC diam: 1.70 cm LEFT ATRIUM             Index LA diam:        2.60 cm 1.54 cm/m LA Vol (A2C):   25.8 ml 15.30 ml/m LA Vol (A4C):   42.0 ml 24.91 ml/m LA Biplane Vol: 33.7 ml 19.99 ml/m  AORTIC VALVE AV Area (Vmax):    1.84 cm AV Area (Vmean):   1.90 cm AV Area (VTI):     1.83 cm AV Vmax:           153.00 cm/s AV Vmean:          103.000 cm/s AV VTI:            0.371 m AV Peak Grad:      9.4 mmHg AV Mean Grad:      5.0 mmHg LVOT Vmax:         99.20 cm/s LVOT Vmean:        69.200 cm/s LVOT VTI:          0.240 m LVOT/AV VTI ratio: 0.65  AORTA Ao Root diam: 2.90 cm Ao Asc diam:  2.60 cm MITRAL VALVE MV Area (PHT): 2.83 cm     SHUNTS MV Area VTI:   2.39 cm     Systemic VTI:  0.24 m MV Peak grad:  5.7 mmHg     Systemic Diam: 1.90 cm MV Mean grad:  2.0 mmHg MV Vmax:       1.19 m/s MV Vmean:      66.0 cm/s MV Decel Time: 268 msec MV E velocity: 82.30 cm/s MV A velocity: 122.00 cm/s MV E/A ratio:  0.67 Cherlynn Kaiser MD Electronically signed by Cherlynn Kaiser MD Signature Date/Time: 07/18/2021/10:24:24 AM    Final (Updated)     Anti-infectives: Anti-infectives (From admission, onward)    Start     Dose/Rate Route Frequency Ordered Stop   07/19/21 0600  cefTRIAXone (ROCEPHIN) 2 g in  sodium chloride 0.9 % 100 mL IVPB        2 g 200 mL/hr over 30 Minutes Intravenous Every 24 hours 07/18/21 1608     07/18/21 0145  cefTRIAXone (ROCEPHIN) 2 g in sodium chloride 0.9 % 100 mL IVPB        2 g 200 mL/hr over 30 Minutes Intravenous  Once 07/18/21 0134 07/18/21 0251       Assessment/Plan: POD 1 lap chole -can dc home from our standpoint -f/u in our clinic -pain meds written for    Rolm Bookbinder 07/20/2021

## 2021-07-20 NOTE — Discharge Summary (Signed)
Physician Discharge Summary  Bridget [name] HBZ:[id number] DOB: 1951-01-17 DOA: 07/17/2021  PCP: Jerline Pain, MD  Admit date: 07/17/2021  Discharge date: 07/20/2021  Admitted From: Home.  Disposition:  Home.  Recommendations for Outpatient Follow-up:  Follow up with PCP in 1-2 weeks. Please obtain BMP/CBC in one week. Advised to follow-up with general surgery as scheduled. Advised to take hydralazine 25 mg 3 times daily for high blood pressure. PCP has to titrate off or adjust her blood pressure medications.  Home Health: None Equipment/Devices: NONE  Discharge Condition: Stable CODE STATUS:Full code Diet recommendation: Heart Healthy   Brief [facility] course: This 71 years old female with PMH significant for hypertension, hypothyroidism, compliant with her home medications presented in the ED with complaints of severe chest pain from home.  Patient reports pain started as feeling like indigestion which does not get improved with Tums.  She also noted her blood pressure was in the 200s . She called EMS.  Patient was given 0.5 sublingual nitro with some improvement in blood pressure but she continues to have chest pain.  On arrival in the ED her blood pressure was 210/110.  Patient also started throwing up.  Patient underwent CT chest abdomen pelvis to evaluate for dissection which was negative.  Patient was started on Cardene infusion for blood pressure control.  She also became bradycardic in the ED so cardiology was consulted. Cardiology does not feel ACS is the etiology of symptoms.  Blood pressure has improved, Cardene drip was discontinued. She was restarted on home blood pressure medications.  Patient also has right upper quadrant abdominal pain,  underwent HIDA scan which was positive for gallstones with acute cholecystitis. General surgery consulted.  Patient underwent laparoscopic cholecystectomy.  Tolerated well.  Is cleared from general surgery to be discharged.  Her  blood pressure is slightly elevated patient is started on hydralazine 25 mg 3 times daily.  Patient feels better and wants to be discharged.  Patient is being discharged home.  Discharge Diagnoses:  Principal Problem:   Hypertensive emergency  Hypertensive emergency: . Resolved. Patient presented with chest pain, nausea,  vomiting, blood pressure in ED 210/110. Patient is started on Cardene drip and admitted in ICU. Blood pressure has improved now, She is started on home blood pressure medications . Cardene drip discontinued.  Blood pressure has improved.   Chest pain:> Resolved. Patient reports severe chest pain associated with high blood pressure. Could be secondary to accelerated hypertension. CTA chest abdomen and pelvis without dissection. Cardiology was consulted does not feel ACS is the etiology of symptoms. Continue Toradol as needed.   Nausea and vomiting: Right upper quadrant pain: Patient also report intractable nausea and vomiting. General surgery was consulted. HIDA scan consistent with cholelithiasis, acute cholecystitis. Started on ceftriaxone General surgery recommended lap chole. Patient underwent laparoscopy cholecystectomy tolerated well. Continue Zofran as needed for nausea and vomiting. Cleared from general surgery to be discharged.  Nausea vomiting resolved   Hypothyroidism: Continue levothyroxine    Discharge Instructions  Discharge Instructions     Call MD for:  difficulty breathing, headache or visual disturbances   Complete by: As directed    Call MD for:  persistant dizziness or light-headedness   Complete by: As directed    Call MD for:  severe uncontrolled pain   Complete by: As directed    Diet - low sodium heart healthy   Complete by: As directed    Diet Carb Modified   Complete by: As directed  Discharge instructions   Complete by: As directed    Advised to follow-up with primary care physician in 1 week. Advised to follow-up with  general surgery as scheduled. Advised to take hydralazine 25 mg 3 times daily for high blood pressure. PCP has to titrate off or adjust her blood pressure medication.   Increase activity slowly   Complete by: As directed    No wound care   Complete by: As directed       Allergies as of 07/20/2021       Reactions   Amlodipine    Other reaction(s): swelling   Beta Adrenergic Blockers    Other reaction(s): fatigue   Codeine Nausea Only   Lisinopril Other (See Comments)   cough   Vicodin [hydrocodone-acetaminophen] Other (See Comments)   Made her feel off        Medication List     TAKE these medications    acetaminophen 500 MG tablet Commonly known as: TYLENOL Take 2 tablets (1,000 mg total) by mouth every 6 (six) hours. What changed:  when to take this reasons to take this   aspirin EC 81 MG tablet Take 81 mg by mouth daily.   diltiazem 180 MG 24 hr capsule Commonly known as: CARDIZEM CD Take 180 mg by mouth daily.   docusate sodium 100 MG capsule Commonly known as: Colace Take 1 capsule (100 mg total) by mouth 2 (two) times daily. What changed:  when to take this reasons to take this   hydrALAZINE 25 MG tablet Commonly known as: APRESOLINE Take 1 tablet (25 mg total) by mouth every 8 (eight) hours.   hydrochlorothiazide 25 MG tablet Commonly known as: HYDRODIURIL Take 25 mg by mouth every morning.   ibuprofen 600 MG tablet Commonly known as: ADVIL Take 1 tablet (600 mg total) by mouth 4 (four) times daily. What changed:  medication strength how much to take when to take this reasons to take this   LORazepam 1 MG tablet Commonly known as: ATIVAN Take 0.5-1 mg by mouth at bedtime as needed for sleep. For anxiety   MELATONIN PO Take 5 mg by mouth at bedtime as needed (sleep).   methocarbamol 750 MG tablet Commonly known as: Robaxin-750 Take 1 tablet (750 mg total) by mouth 4 (four) times daily.   Multivitamin Adult Chew Chew 2 tablets by  mouth daily. gummy   oxyCODONE 5 MG immediate release tablet Commonly known as: Roxicodone Take 1 tablet (5 mg total) by mouth every 4 (four) hours as needed for severe pain.   oxymetazoline 0.05 % nasal spray Commonly known as: AFRIN Place 1 spray into both nostrils 2 (two) times daily as needed for congestion.   Synthroid 125 MCG tablet Generic drug: levothyroxine Take 125 mcg by mouth daily before breakfast.        Follow-up Information     [facility], [facility]. Schedule an appointment as soon as possible for a visit in 3 week(s).   Specialty: General Surgery Why: Our office is scheduling a follow up appointment in about 3 weeks, please call to confirm appointment date/time. Have ID and insurance card with you and arrive 30 min prior to appointment time to check in. Contact information: [address]  [place] [address] [phone]         Jerline Pain, MD Follow up in 1 week(s).   Specialty: Cardiology Contact information: [address] [place] [address] [phone]  Allergies  Allergen Reactions   Amlodipine     Other reaction(s): swelling   Beta Adrenergic Blockers     Other reaction(s): fatigue   Codeine Nausea Only   Lisinopril Other (See Comments)    cough   Vicodin [Hydrocodone-Acetaminophen] Other (See Comments)    Made her feel off    Consultations: General surgery PCCM   Procedures/Studies: CT HEAD WO CONTRAST (5MM)  Result Date: 07/18/2021 CLINICAL DATA:  Benign intracranial hypertension. Bradycardia with nausea and vomiting EXAM: CT HEAD WITHOUT CONTRAST TECHNIQUE: Contiguous axial images were obtained from the base of the skull through the vertex without intravenous contrast. RADIATION DOSE REDUCTION: This exam was performed according to the departmental dose-optimization program which includes automated exposure control, adjustment of the mA and/or kV according to patient size  and/or use of iterative reconstruction technique. COMPARISON:  None Available. FINDINGS: Recent contrast enhanced scan. Brain: No evidence of acute infarction, hemorrhage, hydrocephalus, extra-axial collection or mass lesion/mass effect. Age normal brain volume. Minimal white matter low-density attributed to chronic microvascular ischemia. Vascular: No hyperdense vessel or unexpected calcification. Skull: Normal. Negative for fracture or focal lesion. Sinuses/Orbits: No acute finding. IMPRESSION: Negative head CT. Electronically Signed   By: Jorje Guild M.D.   On: 07/18/2021 04:33   NM Hepatobiliary Liver Func  Result Date: 07/18/2021 CLINICAL DATA:  Gallbladder stones, epigastric pain EXAM: NUCLEAR MEDICINE HEPATOBILIARY IMAGING TECHNIQUE: Sequential images of the abdomen were obtained out to 60 minutes following intravenous administration of radiopharmaceutical. RADIOPHARMACEUTICALS:  5.1 mCi Tc-2m Choletec IV COMPARISON:  Sonogram done earlier today FINDINGS: There is prompt excretion of tracer into the bile ducts. There is prompt entry of tracer into duodenum. There is no activity in the region of the gallbladder fossa. IMPRESSION: There is no demonstrable activity in the gallbladder fossa suggesting possible cystic duct obstruction and acute cholecystitis. Common bile duct appears patent. Electronically Signed   By: PElmer PickerM.D.   On: 07/18/2021 15:49   DG Chest Portable 1 View  Result Date: 07/17/2021 CLINICAL DATA:  Chest and back pain. EXAM: PORTABLE CHEST 1 VIEW COMPARISON:  Chest x-ray 08/17/2012 FINDINGS: The heart size and mediastinal contours are within normal limits. Both lungs are clear. The visualized skeletal structures are unremarkable. IMPRESSION: No active disease. Electronically Signed   By: ARonney AstersM.D.   On: 07/17/2021 22:39   ECHOCARDIOGRAM COMPLETE  Result Date: 07/18/2021    ECHOCARDIOGRAM REPORT   Patient Name:   PCALEDONIA ZOUDate of Exam: 07/18/2021  Medical Rec #:  [id number]      Height:       60.0 in Accession #:    [id number]     Weight:       157.4 lb Date of Birth:  [birth year]     BSA:          1.686 m Patient Age:    89years        BP:           139/52 mmHg Patient Gender: F               HR:           60 bpm. Exam Location:  Inpatient Procedure: 2D Echo, Cardiac Doppler, Color Doppler and Intracardiac            Opacification Agent  MODIFIED REPORT: This report was modified by Cherlynn Kaiser MD on 07/18/2021 due to no revision                                      made.  Indications:     Chest pain  History:         Patient has no prior history of Echocardiogram examinations.                  Risk Factors:Hypertension.  Sonographer:     Clayton Lefort RDCS (AE) Referring Phys:  [id number] Brier MARSHALL Diagnosing Phys: Cherlynn Kaiser MD  Sonographer Comments: Technically difficult study due to poor echo windows. Poor right side windows IMPRESSIONS  1. Left ventricular ejection fraction, by estimation, is 60 to 65%. The left ventricle has normal function. The left ventricle has no regional wall motion abnormalities. There is mild concentric left ventricular hypertrophy. Left ventricular diastolic parameters are consistent with Grade I diastolic dysfunction (impaired relaxation). Abnormal septal motion may be secondary to left bundle branch block.  2. Right ventricular systolic function is normal. The right ventricular size is normal. Tricuspid regurgitation signal is inadequate for assessing PA pressure.  3. The mitral valve is normal in structure. No evidence of mitral valve regurgitation. No evidence of mitral stenosis.  4. The aortic valve is grossly normal. There is mild calcification of the aortic valve. Aortic valve regurgitation is not visualized. Aortic valve sclerosis/calcification is present, without any evidence of aortic stenosis.  5. The inferior vena cava is normal in size with greater than 50% respiratory  variability, suggesting right atrial pressure of 3 mmHg. FINDINGS  Left Ventricle: Left ventricular ejection fraction, by estimation, is 60 to 65%. The left ventricle has normal function. The left ventricle has no regional wall motion abnormalities. Definity contrast agent was given IV to delineate the left ventricular  endocardial borders. The left ventricular internal cavity size was normal in size. There is mild concentric left ventricular hypertrophy. Abnormal (paradoxical) septal motion, consistent with left bundle branch block. Left ventricular diastolic parameters are consistent with Grade I diastolic dysfunction (impaired relaxation). Right Ventricle: The right ventricular size is normal. No increase in right ventricular wall thickness. Right ventricular systolic function is normal. Tricuspid regurgitation signal is inadequate for assessing PA pressure. Left Atrium: Left atrial size was normal in size. Right Atrium: Right atrial size was normal in size. Pericardium: Trivial pericardial effusion is present. Mitral Valve: The mitral valve is normal in structure. No evidence of mitral valve regurgitation. No evidence of mitral valve stenosis. MV peak gradient, 5.7 mmHg. The mean mitral valve gradient is 2.0 mmHg. Tricuspid Valve: The tricuspid valve is normal in structure. Tricuspid valve regurgitation is trivial. No evidence of tricuspid stenosis. Aortic Valve: The aortic valve is grossly normal. There is mild calcification of the aortic valve. Aortic valve regurgitation is not visualized. Aortic valve sclerosis/calcification is present, without any evidence of aortic stenosis. Aortic valve mean gradient measures 5.0 mmHg. Aortic valve peak gradient measures 9.4 mmHg. Aortic valve area, by VTI measures 1.83 cm. Pulmonic Valve: The pulmonic valve was not well visualized. Pulmonic valve regurgitation is trivial. No evidence of pulmonic stenosis. Aorta: The aortic root is normal in size and structure. Venous:  The inferior vena cava is normal in size with greater than 50% respiratory variability, suggesting right atrial pressure of 3 mmHg. IAS/Shunts: The atrial septum is grossly normal.  LEFT  VENTRICLE PLAX 2D LVIDd:         3.90 cm   Diastology LVIDs:         2.40 cm   LV e' medial:    6.96 cm/s LV PW:         1.20 cm   LV E/e' medial:  11.8 LV IVS:        1.20 cm   LV e' lateral:   5.87 cm/s LVOT diam:     1.90 cm   LV E/e' lateral: 14.0 LV SV:         68 LV SV Index:   40 LVOT Area:     2.84 cm  RIGHT VENTRICLE             IVC RV S prime:     12.90 cm/s  IVC diam: 1.70 cm LEFT ATRIUM             Index LA diam:        2.60 cm 1.54 cm/m LA Vol (A2C):   25.8 ml 15.30 ml/m LA Vol (A4C):   42.0 ml 24.91 ml/m LA Biplane Vol: 33.7 ml 19.99 ml/m  AORTIC VALVE AV Area (Vmax):    1.84 cm AV Area (Vmean):   1.90 cm AV Area (VTI):     1.83 cm AV Vmax:           153.00 cm/s AV Vmean:          103.000 cm/s AV VTI:            0.371 m AV Peak Grad:      9.4 mmHg AV Mean Grad:      5.0 mmHg LVOT Vmax:         99.20 cm/s LVOT Vmean:        69.200 cm/s LVOT VTI:          0.240 m LVOT/AV VTI ratio: 0.65  AORTA Ao Root diam: 2.90 cm Ao Asc diam:  2.60 cm MITRAL VALVE MV Area (PHT): 2.83 cm     SHUNTS MV Area VTI:   2.39 cm     Systemic VTI:  0.24 m MV Peak grad:  5.7 mmHg     Systemic Diam: 1.90 cm MV Mean grad:  2.0 mmHg MV Vmax:       1.19 m/s MV Vmean:      66.0 cm/s MV Decel Time: 268 msec MV E velocity: 82.30 cm/s MV A velocity: 122.00 cm/s MV E/A ratio:  0.67 Cherlynn Kaiser MD Electronically signed by Cherlynn Kaiser MD Signature Date/Time: 07/18/2021/10:24:24 AM    Final (Updated)    CT Angio Chest/Abd/Pel for Dissection W and/or Wo Contrast  Result Date: 07/17/2021 CLINICAL DATA:  Acute aortic syndrome suspected, chest pain. EXAM: CT ANGIOGRAPHY CHEST, ABDOMEN AND PELVIS TECHNIQUE: Non-contrast CT of the chest was initially obtained. Multidetector CT imaging through the chest, abdomen and pelvis was performed using  the standard protocol during bolus administration of intravenous contrast. Multiplanar reconstructed images and MIPs were obtained and reviewed to evaluate the vascular anatomy. RADIATION DOSE REDUCTION: This exam was performed according to the departmental dose-optimization program which includes automated exposure control, adjustment of the mA and/or kV according to patient size and/or use of iterative reconstruction technique. CONTRAST:  118m OMNIPAQUE IOHEXOL 350 MG/ML SOLN COMPARISON:  10/16/2005. FINDINGS: CTA CHEST FINDINGS Cardiovascular: The heart is normal in size and there is no pericardial effusion. There is mild atherosclerotic calcification of the aorta without evidence of aneurysm or dissection. The  aortic arch has a bovine configuration. The pulmonary trunk is normal in caliber. Mediastinum/Nodes: No enlarged mediastinal, hilar, or axillary lymph nodes. Thyroid gland, trachea, and esophagus demonstrate no significant findings. Lungs/Pleura: Calcified granuloma and scarring are noted in the medial aspect of the right lower lobe. Mild atelectasis or scarring is noted bilaterally. No consolidation, effusion, or pneumothorax. Musculoskeletal: Mild degenerative changes in the thoracic spine. No acute osseous abnormality. Review of the MIP images confirms the above findings. CTA ABDOMEN AND PELVIS FINDINGS VASCULAR Aorta: Aortic atherosclerosis. Normal caliber aorta without aneurysm, dissection, vasculitis or significant stenosis. Celiac: Patent without evidence of aneurysm, dissection, or vasculitis. There is focal narrowing of the proximal celiac artery which may be due to diaphragmatic position versus median arcuate ligament syndrome. SMA: Patent without evidence of aneurysm, dissection, vasculitis or significant stenosis. Renals: Both renal arteries are patent without evidence of aneurysm, dissection, vasculitis, fibromuscular dysplasia or significant stenosis. An accessory renal artery is noted on  the left. IMA: Patent without evidence of aneurysm, dissection, vasculitis or significant stenosis. Inflow: Patent without evidence of aneurysm, dissection, vasculitis or significant stenosis. Veins: No obvious venous abnormality within the limitations of this arterial phase study. Review of the MIP images confirms the above findings. NON-VASCULAR Hepatobiliary: No focal liver abnormality is seen. Multiple stones are present within the gallbladder. No biliary ductal dilatation. Pancreas: Unremarkable. No pancreatic ductal dilatation or surrounding inflammatory changes. Spleen: Normal in size without focal abnormality. Adrenals/Urinary Tract: There is a 2.6 cm nodule in the right adrenal gland with attenuation -4 Hounsfield units, compatible with an adenoma. The left adrenal gland is within normal limits. The kidneys enhance symmetrically. No renal calculus or hydronephrosis. Bladder is unremarkable. Stomach/Bowel: Stomach is within normal limits. The appendix is not visualized on exam and surgical changes are noted at the cecum. No evidence of bowel wall thickening, distention, or inflammatory changes. No free air or pneumatosis. Lymphatic: No abdominal or pelvic lymphadenopathy. Reproductive: Uterus and bilateral adnexa are unremarkable. Other: Small fat containing umbilical hernia. No abdominopelvic ascites. Musculoskeletal: Degenerative changes are present in the lumbar spine. No acute osseous abnormality. Review of the MIP images confirms the above findings. IMPRESSION: 1. No evidence of aortic aneurysm or dissection. 2. No acute process in the chest, abdomen, or pelvis. 3. Cholelithiasis. 4. Right adrenal adenoma. Electronically Signed   By: Brett Fairy M.D.   On: 07/17/2021 23:34   US Abdomen Limited RUQ (LIVER/GB)  Result Date: 07/18/2021 CLINICAL DATA:  Pain EXAM: ULTRASOUND ABDOMEN LIMITED RIGHT UPPER QUADRANT COMPARISON:  CT 05/17/2021 FINDINGS: Gallbladder: Multiple gallstones within the  gallbladder. 1.9 cm gallstone in the neck. Gallbladder wall is slightly thickened at 4 mm. Negative sonographic Murphy's. Common bile duct: Diameter: Normal caliber, 4 mm Liver: 6 portal vein is patent on color Doppler imaging with normal direction of blood flow towards the liver. Other: None. IMPRESSION: Cholelithiasis. Slight gallbladder wall thickening. Recommend clinical correlation to exclude acute cholecystitis. Electronically Signed   By: Rolm Baptise M.D.   On: 07/18/2021 01:26      Subjective: Patient was seen and examined at bedside.  Overnight events noted. Patient reports feeling much improved abdominal pain has resolved.  She wants to be discharged.  Discharge Exam: Vitals:   07/20/21 0646 07/20/21 1241  BP: (!) 193/62 (!) 172/46  Pulse: (!) 56   Resp: 16   Temp:    SpO2:     Vitals:   07/20/21 0523 07/20/21 0639 07/20/21 0646 07/20/21 1241  BP: (!) 183/66 (!) 183/66 Marland Kitchen)  193/62 (!) 172/46  Pulse: (!) 58  (!) 56   Resp: 17  16   Temp: 98.4 F (36.9 C)     TempSrc: Oral     SpO2: 95%     Weight: 72.4 kg     Height:        General: Pt is alert, awake, not in acute distress Cardiovascular: RRR, S1/S2 +, no rubs, no gallops Respiratory: CTA bilaterally, no wheezing, no rhonchi Abdominal: Soft, NT, ND, bowel sounds + Extremities: no edema, no cyanosis    The results of significant diagnostics from this hospitalization (including imaging, microbiology, ancillary and laboratory) are listed below for reference.     Microbiology: Recent Results (from the past 240 hour(s))  MRSA Next Gen by PCR, Nasal     Status: None   Collection Time: 07/18/21  2:30 AM   Specimen: Nasal Mucosa; Nasal Swab  Result Value Ref Range Status   MRSA by PCR Next Gen NOT DETECTED NOT DETECTED Final    Comment: (NOTE) The GeneXpert MRSA Assay (FDA approved for NASAL specimens only), is one component of a comprehensive MRSA colonization surveillance program. It is not intended to diagnose  MRSA infection nor to guide or monitor treatment for MRSA infections. Test performance is not FDA approved in patients less than 80 years old. Performed at [facility], [place] [address], [place], [place] [address]   Surgical PCR screen     Status: Abnormal   Collection Time: 07/19/21  8:16 AM   Specimen: Nasal Mucosa; Nasal Swab  Result Value Ref Range Status   MRSA, PCR NEGATIVE NEGATIVE Final   Staphylococcus aureus POSITIVE (A) NEGATIVE Final    Comment: (NOTE) The Xpert SA Assay (FDA approved for NASAL specimens in patients 5 years of age and older), is one component of a comprehensive surveillance program. It is not intended to diagnose infection nor to guide or monitor treatment. Performed at [facility], [place] [address] [place], [place] [address]      Labs: BNP (last 3 results) No results for input(s): BNP in the last 8760 hours. Basic Metabolic Panel: Recent Labs  Lab 07/17/21 2233 07/17/21 2237 07/18/21 0549 07/19/21 0249 07/20/21 0305  NA 140  --  146* 138 140  K 3.4*  --  3.5 3.2* 4.0  CL 107  --  107 103 106  CO2 23  --  '29 28 24  '$ GLUCOSE 149*  --  147* 99 199*  BUN 24*  --  '18 14 17  '$ CREATININE 1.21* 1.30* 1.00 0.92 1.00  CALCIUM 9.6  --  8.9 8.5* 9.3  MG  --   --  2.2  --  2.5*  PHOS  --   --  3.6  --  2.8   Liver Function Tests: Recent Labs  Lab 07/17/21 2340 07/19/21 0249 07/20/21 0305  AST 19 23 39  ALT 17 18 37  ALKPHOS 83 69 79  BILITOT 0.7 0.7 0.8  PROT 6.6 5.9* 6.7  ALBUMIN 3.7 3.3* 3.7   Recent Labs  Lab 07/17/21 2340  LIPASE 46   No results for input(s): AMMONIA in the last 168 hours. CBC: Recent Labs  Lab 07/17/21 2233 07/18/21 0549 07/19/21 0249 07/20/21 0305  WBC 13.0* 11.7* 7.7 13.7*  NEUTROABS 9.6*  --   --   --   HGB 12.5 12.5 12.4 13.7  HCT 37.4 37.0 37.2 40.2  MCV 81.1 79.7* 81.0 80.1  PLT 294 285 267 331  Cardiac Enzymes: No results for input(s): CKTOTAL, CKMB, CKMBINDEX, TROPONINI in the last  168 hours. BNP: Invalid input(s): POCBNP CBG: Recent Labs  Lab 07/18/21 0200 07/18/21 0338 07/18/21 0730  GLUCAP 176* 166* 127*   D-Dimer No results for input(s): DDIMER in the last 72 hours. Hgb A1c No results for input(s): HGBA1C in the last 72 hours. Lipid Profile No results for input(s): CHOL, HDL, LDLCALC, TRIG, CHOLHDL, LDLDIRECT in the last 72 hours. Thyroid function studies Recent Labs    07/18/21 0549  TSH 3.248   Anemia work up No results for input(s): VITAMINB12, FOLATE, FERRITIN, TIBC, IRON, RETICCTPCT in the last 72 hours. Urinalysis    Component Value Date/Time   COLORURINE YELLOW 07/18/2021 0223   APPEARANCEUR CLOUDY (A) 07/18/2021 0223   LABSPEC >1.046 (H) 07/18/2021 0223   PHURINE 8.0 07/18/2021 0223   GLUCOSEU NEGATIVE 07/18/2021 0223   HGBUR NEGATIVE 07/18/2021 0223   BILIRUBINUR NEGATIVE 07/18/2021 0223   BILIRUBINUR N 10/15/2015 1320   KETONESUR 20 (A) 07/18/2021 0223   PROTEINUR NEGATIVE 07/18/2021 0223   UROBILINOGEN negative 10/15/2015 1320   NITRITE NEGATIVE 07/18/2021 0223   LEUKOCYTESUR NEGATIVE 07/18/2021 0223   Sepsis Labs Invalid input(s): PROCALCITONIN,  WBC,  LACTICIDVEN Microbiology Recent Results (from the past 240 hour(s))  MRSA Next Gen by PCR, Nasal     Status: None   Collection Time: 07/18/21  2:30 AM   Specimen: Nasal Mucosa; Nasal Swab  Result Value Ref Range Status   MRSA by PCR Next Gen NOT DETECTED NOT DETECTED Final    Comment: (NOTE) The GeneXpert MRSA Assay (FDA approved for NASAL specimens only), is one component of a comprehensive MRSA colonization surveillance program. It is not intended to diagnose MRSA infection nor to guide or monitor treatment for MRSA infections. Test performance is not FDA approved in patients less than 81 years old. Performed at [facility], [place] [address] [place], [place] [address]   Surgical PCR screen     Status: Abnormal   Collection Time: 07/19/21  8:16 AM   Specimen:  Nasal Mucosa; Nasal Swab  Result Value Ref Range Status   MRSA, PCR NEGATIVE NEGATIVE Final   Staphylococcus aureus POSITIVE (A) NEGATIVE Final    Comment: (NOTE) The Xpert SA Assay (FDA approved for NASAL specimens in patients 31 years of age and older), is one component of a comprehensive surveillance program. It is not intended to diagnose infection nor to guide or monitor treatment. Performed at [facility], [place] [address] [place], [place] [address]      Time coordinating discharge: Over 30 minutes  SIGNED:   Shawna Clamp, MD  [facility] 07/20/2021, 5:12 PM Pager   If 7PM-7AM, please contact night-coverage

## 2021-07-22 LAB — SURGICAL PATHOLOGY

## 2021-07-28 DIAGNOSIS — I10 Essential (primary) hypertension: Secondary | ICD-10-CM | POA: Diagnosis not present

## 2021-07-28 DIAGNOSIS — Z9049 Acquired absence of other specified parts of digestive tract: Secondary | ICD-10-CM | POA: Diagnosis not present

## 2021-07-28 DIAGNOSIS — E876 Hypokalemia: Secondary | ICD-10-CM | POA: Diagnosis not present

## 2021-08-01 ENCOUNTER — Other Ambulatory Visit (HOSPITAL_COMMUNITY): Payer: Self-pay | Admitting: Surgery

## 2021-08-01 ENCOUNTER — Ambulatory Visit (HOSPITAL_COMMUNITY)
Admission: RE | Admit: 2021-08-01 | Discharge: 2021-08-01 | Disposition: A | Discharge status: Home / Self Care | Payer: Medicare Other | Source: Ambulatory Visit | Attending: Surgery | Admitting: Surgery

## 2021-08-01 ENCOUNTER — Other Ambulatory Visit: Payer: Self-pay | Admitting: Surgery

## 2021-08-01 DIAGNOSIS — K668 Other specified disorders of peritoneum: Secondary | ICD-10-CM | POA: Diagnosis not present

## 2021-08-01 DIAGNOSIS — R109 Unspecified abdominal pain: Secondary | ICD-10-CM | POA: Diagnosis not present

## 2021-08-01 DIAGNOSIS — L0291 Cutaneous abscess, unspecified: Secondary | ICD-10-CM

## 2021-08-01 DIAGNOSIS — I700 Atherosclerosis of aorta: Secondary | ICD-10-CM | POA: Diagnosis not present

## 2021-08-01 MED ORDER — IOHEXOL 300 MG/ML  SOLN
100.0000 mL | Freq: Once | INTRAMUSCULAR | Status: AC | PRN
Start: 2021-08-01 — End: 2021-08-01
  Administered 2021-08-01: 100 mL via INTRAVENOUS

## 2021-08-01 MED ORDER — IOHEXOL 9 MG/ML PO SOLN
1000.0000 mL | ORAL | Status: AC
  Administered 2021-08-01: 1000 mL via ORAL

## 2021-08-27 ENCOUNTER — Ambulatory Visit: Payer: Medicare Other | Admitting: Nurse Practitioner

## 2021-09-02 DIAGNOSIS — K5730 Diverticulosis of large intestine without perforation or abscess without bleeding: Secondary | ICD-10-CM | POA: Diagnosis not present

## 2021-09-02 DIAGNOSIS — Z1211 Encounter for screening for malignant neoplasm of colon: Secondary | ICD-10-CM | POA: Diagnosis not present

## 2021-09-02 DIAGNOSIS — K648 Other hemorrhoids: Secondary | ICD-10-CM | POA: Diagnosis not present

## 2021-09-18 DIAGNOSIS — Z1331 Encounter for screening for depression: Secondary | ICD-10-CM | POA: Diagnosis not present

## 2021-09-18 DIAGNOSIS — F411 Generalized anxiety disorder: Secondary | ICD-10-CM | POA: Diagnosis not present

## 2021-09-18 DIAGNOSIS — E7800 Pure hypercholesterolemia, unspecified: Secondary | ICD-10-CM | POA: Diagnosis not present

## 2021-09-18 DIAGNOSIS — R7303 Prediabetes: Secondary | ICD-10-CM | POA: Diagnosis not present

## 2021-09-18 DIAGNOSIS — R350 Frequency of micturition: Secondary | ICD-10-CM | POA: Diagnosis not present

## 2021-09-18 DIAGNOSIS — E063 Autoimmune thyroiditis: Secondary | ICD-10-CM | POA: Diagnosis not present

## 2021-09-18 DIAGNOSIS — E039 Hypothyroidism, unspecified: Secondary | ICD-10-CM | POA: Diagnosis not present

## 2021-09-18 DIAGNOSIS — I10 Essential (primary) hypertension: Secondary | ICD-10-CM | POA: Diagnosis not present

## 2021-09-18 DIAGNOSIS — J309 Allergic rhinitis, unspecified: Secondary | ICD-10-CM | POA: Diagnosis not present

## 2021-09-18 DIAGNOSIS — F39 Unspecified mood [affective] disorder: Secondary | ICD-10-CM | POA: Diagnosis not present

## 2021-09-18 DIAGNOSIS — M8580 Other specified disorders of bone density and structure, unspecified site: Secondary | ICD-10-CM | POA: Diagnosis not present

## 2021-09-18 DIAGNOSIS — Z0000 Encounter for general adult medical examination without abnormal findings: Secondary | ICD-10-CM | POA: Diagnosis not present

## 2021-11-15 DIAGNOSIS — Z23 Encounter for immunization: Secondary | ICD-10-CM | POA: Diagnosis not present

## 2022-01-21 DIAGNOSIS — M25511 Pain in right shoulder: Secondary | ICD-10-CM | POA: Diagnosis not present

## 2022-01-21 DIAGNOSIS — M79604 Pain in right leg: Secondary | ICD-10-CM | POA: Diagnosis not present

## 2022-01-21 DIAGNOSIS — M79605 Pain in left leg: Secondary | ICD-10-CM | POA: Diagnosis not present

## 2022-01-30 DIAGNOSIS — M6281 Muscle weakness (generalized): Secondary | ICD-10-CM | POA: Diagnosis not present

## 2022-01-30 DIAGNOSIS — M25511 Pain in right shoulder: Secondary | ICD-10-CM | POA: Diagnosis not present

## 2022-01-30 DIAGNOSIS — M25611 Stiffness of right shoulder, not elsewhere classified: Secondary | ICD-10-CM | POA: Diagnosis not present

## 2022-02-02 DIAGNOSIS — M25511 Pain in right shoulder: Secondary | ICD-10-CM | POA: Diagnosis not present

## 2022-02-02 DIAGNOSIS — M25611 Stiffness of right shoulder, not elsewhere classified: Secondary | ICD-10-CM | POA: Diagnosis not present

## 2022-02-02 DIAGNOSIS — M6281 Muscle weakness (generalized): Secondary | ICD-10-CM | POA: Diagnosis not present

## 2022-02-10 DIAGNOSIS — M25611 Stiffness of right shoulder, not elsewhere classified: Secondary | ICD-10-CM | POA: Diagnosis not present

## 2022-02-10 DIAGNOSIS — M6281 Muscle weakness (generalized): Secondary | ICD-10-CM | POA: Diagnosis not present

## 2022-02-10 DIAGNOSIS — M25511 Pain in right shoulder: Secondary | ICD-10-CM | POA: Diagnosis not present

## 2022-02-12 DIAGNOSIS — M25511 Pain in right shoulder: Secondary | ICD-10-CM | POA: Diagnosis not present

## 2022-02-12 DIAGNOSIS — M25611 Stiffness of right shoulder, not elsewhere classified: Secondary | ICD-10-CM | POA: Diagnosis not present

## 2022-02-12 DIAGNOSIS — M6281 Muscle weakness (generalized): Secondary | ICD-10-CM | POA: Diagnosis not present

## 2022-02-24 DIAGNOSIS — M25511 Pain in right shoulder: Secondary | ICD-10-CM | POA: Diagnosis not present

## 2022-02-24 DIAGNOSIS — M6281 Muscle weakness (generalized): Secondary | ICD-10-CM | POA: Diagnosis not present

## 2022-02-24 DIAGNOSIS — M25611 Stiffness of right shoulder, not elsewhere classified: Secondary | ICD-10-CM | POA: Diagnosis not present

## 2022-02-26 DIAGNOSIS — M25611 Stiffness of right shoulder, not elsewhere classified: Secondary | ICD-10-CM | POA: Diagnosis not present

## 2022-02-26 DIAGNOSIS — M25511 Pain in right shoulder: Secondary | ICD-10-CM | POA: Diagnosis not present

## 2022-02-26 DIAGNOSIS — M6281 Muscle weakness (generalized): Secondary | ICD-10-CM | POA: Diagnosis not present

## 2022-03-06 DIAGNOSIS — M25511 Pain in right shoulder: Secondary | ICD-10-CM | POA: Diagnosis not present

## 2022-03-06 DIAGNOSIS — M6281 Muscle weakness (generalized): Secondary | ICD-10-CM | POA: Diagnosis not present

## 2022-03-06 DIAGNOSIS — M25611 Stiffness of right shoulder, not elsewhere classified: Secondary | ICD-10-CM | POA: Diagnosis not present

## 2022-03-20 DIAGNOSIS — E039 Hypothyroidism, unspecified: Secondary | ICD-10-CM | POA: Diagnosis not present

## 2022-03-20 DIAGNOSIS — M25561 Pain in right knee: Secondary | ICD-10-CM | POA: Diagnosis not present

## 2022-03-20 DIAGNOSIS — E7800 Pure hypercholesterolemia, unspecified: Secondary | ICD-10-CM | POA: Diagnosis not present

## 2022-03-20 DIAGNOSIS — I700 Atherosclerosis of aorta: Secondary | ICD-10-CM | POA: Diagnosis not present

## 2022-03-20 DIAGNOSIS — I10 Essential (primary) hypertension: Secondary | ICD-10-CM | POA: Diagnosis not present

## 2022-03-20 DIAGNOSIS — F39 Unspecified mood [affective] disorder: Secondary | ICD-10-CM | POA: Diagnosis not present

## 2022-04-14 ENCOUNTER — Encounter (HOSPITAL_COMMUNITY): Payer: Self-pay

## 2022-04-14 ENCOUNTER — Emergency Department (HOSPITAL_COMMUNITY): Payer: Medicare Other

## 2022-04-14 ENCOUNTER — Emergency Department (HOSPITAL_COMMUNITY)
Admission: EM | Admit: 2022-04-14 | Discharge: 2022-04-14 | Disposition: A | Discharge status: Home / Self Care | Payer: Medicare Other | Attending: Emergency Medicine | Admitting: Emergency Medicine

## 2022-04-14 ENCOUNTER — Other Ambulatory Visit: Payer: Self-pay

## 2022-04-14 DIAGNOSIS — Y92410 Unspecified street and highway as the place of occurrence of the external cause: Secondary | ICD-10-CM | POA: Insufficient documentation

## 2022-04-14 DIAGNOSIS — Y9301 Activity, walking, marching and hiking: Secondary | ICD-10-CM | POA: Diagnosis not present

## 2022-04-14 DIAGNOSIS — D3501 Benign neoplasm of right adrenal gland: Secondary | ICD-10-CM | POA: Diagnosis not present

## 2022-04-14 DIAGNOSIS — S0993XA Unspecified injury of face, initial encounter: Secondary | ICD-10-CM | POA: Diagnosis not present

## 2022-04-14 DIAGNOSIS — I700 Atherosclerosis of aorta: Secondary | ICD-10-CM | POA: Diagnosis not present

## 2022-04-14 DIAGNOSIS — R58 Hemorrhage, not elsewhere classified: Secondary | ICD-10-CM | POA: Diagnosis not present

## 2022-04-14 DIAGNOSIS — M549 Dorsalgia, unspecified: Secondary | ICD-10-CM | POA: Diagnosis not present

## 2022-04-14 DIAGNOSIS — Z7982 Long term (current) use of aspirin: Secondary | ICD-10-CM | POA: Insufficient documentation

## 2022-04-14 DIAGNOSIS — S2232XA Fracture of one rib, left side, initial encounter for closed fracture: Secondary | ICD-10-CM | POA: Diagnosis not present

## 2022-04-14 DIAGNOSIS — S0990XA Unspecified injury of head, initial encounter: Secondary | ICD-10-CM | POA: Diagnosis not present

## 2022-04-14 DIAGNOSIS — Z041 Encounter for examination and observation following transport accident: Secondary | ICD-10-CM | POA: Diagnosis not present

## 2022-04-14 DIAGNOSIS — S00511A Abrasion of lip, initial encounter: Secondary | ICD-10-CM | POA: Diagnosis not present

## 2022-04-14 DIAGNOSIS — M79643 Pain in unspecified hand: Secondary | ICD-10-CM | POA: Diagnosis not present

## 2022-04-14 DIAGNOSIS — T1490XA Injury, unspecified, initial encounter: Secondary | ICD-10-CM

## 2022-04-14 DIAGNOSIS — S299XXA Unspecified injury of thorax, initial encounter: Secondary | ICD-10-CM | POA: Diagnosis present

## 2022-04-14 DIAGNOSIS — T07XXXA Unspecified multiple injuries, initial encounter: Secondary | ICD-10-CM | POA: Diagnosis not present

## 2022-04-14 DIAGNOSIS — R079 Chest pain, unspecified: Secondary | ICD-10-CM | POA: Diagnosis not present

## 2022-04-14 DIAGNOSIS — R609 Edema, unspecified: Secondary | ICD-10-CM | POA: Diagnosis not present

## 2022-04-14 LAB — COMPREHENSIVE METABOLIC PANEL
ALT: 22 U/L (ref 0–44)
AST: 24 U/L (ref 15–41)
Albumin: 3.6 g/dL (ref 3.5–5.0)
Alkaline Phosphatase: 82 U/L (ref 38–126)
Anion gap: 9 (ref 5–15)
BUN: 16 mg/dL (ref 8–23)
CO2: 25 mmol/L (ref 22–32)
Calcium: 9.1 mg/dL (ref 8.9–10.3)
Chloride: 106 mmol/L (ref 98–111)
Creatinine, Ser: 0.85 mg/dL (ref 0.44–1.00)
GFR, Estimated: >60 mL/min (ref >60)
Glucose, Bld: 141 mg/dL — ABNORMAL HIGH (ref 70–99)
Potassium: 3.2 mmol/L — ABNORMAL LOW (ref 3.5–5.1)
Sodium: 140 mmol/L (ref 135–145)
Total Bilirubin: 0.8 mg/dL (ref 0.3–1.2)
Total Protein: 6.4 g/dL — ABNORMAL LOW (ref 6.5–8.1)

## 2022-04-14 LAB — LACTIC ACID, PLASMA: Lactic Acid, Venous: 1 mmol/L (ref 0.5–1.9)

## 2022-04-14 LAB — SAMPLE TO BLOOD BANK

## 2022-04-14 LAB — PROTIME-INR
INR: 1 (ref 0.8–1.2)
Prothrombin Time: 13.5 seconds (ref 11.4–15.2)

## 2022-04-14 LAB — I-STAT CHEM 8, ED
BUN: 17 mg/dL (ref 8–23)
Calcium, Ion: 1.18 mmol/L (ref 1.15–1.40)
Chloride: 105 mmol/L (ref 98–111)
Creatinine, Ser: 0.8 mg/dL (ref 0.44–1.00)
Glucose, Bld: 139 mg/dL — ABNORMAL HIGH (ref 70–99)
HCT: 37 % (ref 36.0–46.0)
Hemoglobin: 12.6 g/dL (ref 12.0–15.0)
Potassium: 3.2 mmol/L — ABNORMAL LOW (ref 3.5–5.1)
Sodium: 142 mmol/L (ref 135–145)
TCO2: 25 mmol/L (ref 22–32)

## 2022-04-14 LAB — CBC
HCT: 38.1 % (ref 36.0–46.0)
Hemoglobin: 13 g/dL (ref 12.0–15.0)
MCH: 27.1 pg (ref 26.0–34.0)
MCHC: 34.1 g/dL (ref 30.0–36.0)
MCV: 79.5 fL — ABNORMAL LOW (ref 80.0–100.0)
Platelets: 315 10*3/uL (ref 150–400)
RBC: 4.79 MIL/uL (ref 3.87–5.11)
RDW: 13.2 % (ref 11.5–15.5)
WBC: 8.4 10*3/uL (ref 4.0–10.5)
nRBC: 0 % (ref 0.0–0.2)

## 2022-04-14 LAB — ETHANOL: Alcohol, Ethyl (B): <10 mg/dL (ref <10)

## 2022-04-14 MED ORDER — ACETAMINOPHEN-CODEINE 300-30 MG PO TABS
1.0000 | ORAL_TABLET | Freq: Once | ORAL | Status: AC
  Administered 2022-04-14: 1 via ORAL
  Filled 2022-04-14: qty 1

## 2022-04-14 MED ORDER — PROMETHAZINE HCL 25 MG PO TABS: 25.0000 mg | ORAL_TABLET | Freq: Three times a day (TID) | ORAL | 0 refills | Status: DC | PRN

## 2022-04-14 MED ORDER — ACETAMINOPHEN-CODEINE 300-30 MG PO TABS: 1.0000 | ORAL_TABLET | Freq: Four times a day (QID) | ORAL | 0 refills | Status: DC | PRN

## 2022-04-14 MED ORDER — IOHEXOL 350 MG/ML SOLN
75.0000 mL | Freq: Once | INTRAVENOUS | Status: AC | PRN
  Administered 2022-04-14: 75 mL via INTRAVENOUS
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
## 2022-04-14 NOTE — Discharge Instructions (Signed)
Your history, exam, workup today revealed a rib fracture but otherwise no evidence of significant lung injury.  Your vital signs were reassuring without hypoxia.  Your imaging of the face did not show any acute skull fracture or intracranial bleed but did show the dental injury as we discussed.  Please call your dentist for close follow-up.  Given the lack of tenderness on the bone in your hand we have low suspicion for bony injury.  Please follow-up with your primary doctor for this.  If any symptoms change or worsen acutely, please return to the nearest emergency department.  Please use incentive spirometer to prevent pneumonia development.

## 2022-04-14 NOTE — ED Notes (Signed)
Incentive spirometer given to pt - educated on how to use it.

## 2022-04-14 NOTE — ED Notes (Signed)
Patient transported to CT by TRN ?
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
## 2022-04-20 DIAGNOSIS — E113293 Type 2 diabetes mellitus with mild nonproliferative diabetic retinopathy without macular edema, bilateral: Secondary | ICD-10-CM | POA: Diagnosis not present

## 2022-04-20 DIAGNOSIS — H5213 Myopia, bilateral: Secondary | ICD-10-CM | POA: Diagnosis not present

## 2022-04-20 DIAGNOSIS — H52203 Unspecified astigmatism, bilateral: Secondary | ICD-10-CM | POA: Diagnosis not present

## 2022-04-21 DIAGNOSIS — I10 Essential (primary) hypertension: Secondary | ICD-10-CM | POA: Diagnosis not present

## 2022-04-21 DIAGNOSIS — T1490XA Injury, unspecified, initial encounter: Secondary | ICD-10-CM | POA: Diagnosis not present

## 2022-04-21 DIAGNOSIS — S00511D Abrasion of lip, subsequent encounter: Secondary | ICD-10-CM | POA: Diagnosis not present

## 2022-04-21 DIAGNOSIS — Z23 Encounter for immunization: Secondary | ICD-10-CM | POA: Diagnosis not present

## 2022-04-21 DIAGNOSIS — E876 Hypokalemia: Secondary | ICD-10-CM | POA: Diagnosis not present

## 2022-04-21 DIAGNOSIS — S299XXD Unspecified injury of thorax, subsequent encounter: Secondary | ICD-10-CM | POA: Diagnosis not present

## 2022-04-21 DIAGNOSIS — S2232XD Fracture of one rib, left side, subsequent encounter for fracture with routine healing: Secondary | ICD-10-CM | POA: Diagnosis not present

## 2022-05-11 DIAGNOSIS — S62144A Nondisplaced fracture of body of hamate [unciform] bone, right wrist, initial encounter for closed fracture: Secondary | ICD-10-CM | POA: Diagnosis not present

## 2022-05-20 DIAGNOSIS — M25561 Pain in right knee: Secondary | ICD-10-CM | POA: Diagnosis not present

## 2022-05-25 DIAGNOSIS — S62144D Nondisplaced fracture of body of hamate [unciform] bone, right wrist, subsequent encounter for fracture with routine healing: Secondary | ICD-10-CM | POA: Diagnosis not present

## 2022-06-03 DIAGNOSIS — R262 Difficulty in walking, not elsewhere classified: Secondary | ICD-10-CM | POA: Diagnosis not present

## 2022-06-03 DIAGNOSIS — M6281 Muscle weakness (generalized): Secondary | ICD-10-CM | POA: Diagnosis not present

## 2022-06-03 DIAGNOSIS — M25561 Pain in right knee: Secondary | ICD-10-CM | POA: Diagnosis not present
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
## 2022-06-08 DIAGNOSIS — M6281 Muscle weakness (generalized): Secondary | ICD-10-CM | POA: Diagnosis not present

## 2022-06-08 DIAGNOSIS — R262 Difficulty in walking, not elsewhere classified: Secondary | ICD-10-CM | POA: Diagnosis not present

## 2022-06-08 DIAGNOSIS — M25561 Pain in right knee: Secondary | ICD-10-CM | POA: Diagnosis not present

## 2022-06-10 DIAGNOSIS — M25561 Pain in right knee: Secondary | ICD-10-CM | POA: Diagnosis not present

## 2022-06-10 DIAGNOSIS — R262 Difficulty in walking, not elsewhere classified: Secondary | ICD-10-CM | POA: Diagnosis not present

## 2022-06-10 DIAGNOSIS — M6281 Muscle weakness (generalized): Secondary | ICD-10-CM | POA: Diagnosis not present

## 2022-06-15 DIAGNOSIS — M6281 Muscle weakness (generalized): Secondary | ICD-10-CM | POA: Diagnosis not present

## 2022-06-15 DIAGNOSIS — R262 Difficulty in walking, not elsewhere classified: Secondary | ICD-10-CM | POA: Diagnosis not present

## 2022-06-15 DIAGNOSIS — M25561 Pain in right knee: Secondary | ICD-10-CM | POA: Diagnosis not present

## 2022-06-17 DIAGNOSIS — M6281 Muscle weakness (generalized): Secondary | ICD-10-CM | POA: Diagnosis not present

## 2022-06-17 DIAGNOSIS — M25561 Pain in right knee: Secondary | ICD-10-CM | POA: Diagnosis not present

## 2022-06-17 DIAGNOSIS — R262 Difficulty in walking, not elsewhere classified: Secondary | ICD-10-CM | POA: Diagnosis not present

## 2022-06-19 DIAGNOSIS — I10 Essential (primary) hypertension: Secondary | ICD-10-CM | POA: Diagnosis not present

## 2022-06-20 DIAGNOSIS — M25561 Pain in right knee: Secondary | ICD-10-CM | POA: Diagnosis not present

## 2022-06-22 DIAGNOSIS — M25561 Pain in right knee: Secondary | ICD-10-CM | POA: Diagnosis not present

## 2022-06-22 DIAGNOSIS — S62144D Nondisplaced fracture of body of hamate [unciform] bone, right wrist, subsequent encounter for fracture with routine healing: Secondary | ICD-10-CM | POA: Diagnosis not present

## 2022-06-22 DIAGNOSIS — M6281 Muscle weakness (generalized): Secondary | ICD-10-CM | POA: Diagnosis not present

## 2022-06-22 DIAGNOSIS — M1711 Unilateral primary osteoarthritis, right knee: Secondary | ICD-10-CM | POA: Diagnosis not present

## 2022-06-22 DIAGNOSIS — R262 Difficulty in walking, not elsewhere classified: Secondary | ICD-10-CM | POA: Diagnosis not present

## 2022-06-23 ENCOUNTER — Ambulatory Visit (HOSPITAL_BASED_OUTPATIENT_CLINIC_OR_DEPARTMENT_OTHER): Payer: Medicare Other | Admitting: Physical Therapy

## 2022-06-24 DIAGNOSIS — R262 Difficulty in walking, not elsewhere classified: Secondary | ICD-10-CM | POA: Diagnosis not present

## 2022-06-24 DIAGNOSIS — M25561 Pain in right knee: Secondary | ICD-10-CM | POA: Diagnosis not present

## 2022-06-24 DIAGNOSIS — M6281 Muscle weakness (generalized): Secondary | ICD-10-CM | POA: Diagnosis not present

## 2022-06-29 DIAGNOSIS — D225 Melanocytic nevi of trunk: Secondary | ICD-10-CM | POA: Diagnosis not present

## 2022-06-29 DIAGNOSIS — D2271 Melanocytic nevi of right lower limb, including hip: Secondary | ICD-10-CM | POA: Diagnosis not present

## 2022-06-29 DIAGNOSIS — L309 Dermatitis, unspecified: Secondary | ICD-10-CM | POA: Diagnosis not present

## 2022-06-29 DIAGNOSIS — D2272 Melanocytic nevi of left lower limb, including hip: Secondary | ICD-10-CM | POA: Diagnosis not present

## 2022-06-29 DIAGNOSIS — D2261 Melanocytic nevi of right upper limb, including shoulder: Secondary | ICD-10-CM | POA: Diagnosis not present

## 2022-06-29 DIAGNOSIS — D2262 Melanocytic nevi of left upper limb, including shoulder: Secondary | ICD-10-CM | POA: Diagnosis not present

## 2022-06-29 DIAGNOSIS — D485 Neoplasm of uncertain behavior of skin: Secondary | ICD-10-CM | POA: Diagnosis not present

## 2022-06-29 DIAGNOSIS — L821 Other seborrheic keratosis: Secondary | ICD-10-CM | POA: Diagnosis not present

## 2022-06-29 DIAGNOSIS — D0372 Melanoma in situ of left lower limb, including hip: Secondary | ICD-10-CM | POA: Diagnosis not present

## 2022-06-29 DIAGNOSIS — L300 Nummular dermatitis: Secondary | ICD-10-CM | POA: Diagnosis not present

## 2022-06-29 DIAGNOSIS — D1801 Hemangioma of skin and subcutaneous tissue: Secondary | ICD-10-CM | POA: Diagnosis not present

## 2022-07-01 DIAGNOSIS — R262 Difficulty in walking, not elsewhere classified: Secondary | ICD-10-CM | POA: Diagnosis not present

## 2022-07-01 DIAGNOSIS — M25561 Pain in right knee: Secondary | ICD-10-CM | POA: Diagnosis not present

## 2022-07-01 DIAGNOSIS — M6281 Muscle weakness (generalized): Secondary | ICD-10-CM | POA: Diagnosis not present

## 2022-07-02 DIAGNOSIS — S82141D Displaced bicondylar fracture of right tibia, subsequent encounter for closed fracture with routine healing: Secondary | ICD-10-CM | POA: Diagnosis not present

## 2022-07-02 DIAGNOSIS — R0602 Shortness of breath: Secondary | ICD-10-CM | POA: Diagnosis not present

## 2022-07-02 DIAGNOSIS — M7989 Other specified soft tissue disorders: Secondary | ICD-10-CM | POA: Diagnosis not present

## 2022-07-02 DIAGNOSIS — R5383 Other fatigue: Secondary | ICD-10-CM | POA: Diagnosis not present

## 2022-07-02 DIAGNOSIS — E876 Hypokalemia: Secondary | ICD-10-CM | POA: Diagnosis not present

## 2022-07-02 DIAGNOSIS — I10 Essential (primary) hypertension: Secondary | ICD-10-CM | POA: Diagnosis not present

## 2022-07-02 DIAGNOSIS — S83241D Other tear of medial meniscus, current injury, right knee, subsequent encounter: Secondary | ICD-10-CM | POA: Diagnosis not present

## 2022-07-02 DIAGNOSIS — R001 Bradycardia, unspecified: Secondary | ICD-10-CM | POA: Diagnosis not present

## 2022-07-08 ENCOUNTER — Other Ambulatory Visit: Payer: Self-pay | Admitting: Internal Medicine

## 2022-07-08 DIAGNOSIS — Z0000 Encounter for general adult medical examination without abnormal findings: Secondary | ICD-10-CM

## 2022-07-09 DIAGNOSIS — I10 Essential (primary) hypertension: Secondary | ICD-10-CM | POA: Diagnosis not present

## 2022-07-09 DIAGNOSIS — R0602 Shortness of breath: Secondary | ICD-10-CM | POA: Diagnosis not present

## 2022-07-09 DIAGNOSIS — R001 Bradycardia, unspecified: Secondary | ICD-10-CM | POA: Diagnosis not present

## 2022-07-09 DIAGNOSIS — D038 Melanoma in situ of other sites: Secondary | ICD-10-CM | POA: Diagnosis not present

## 2022-07-10 DIAGNOSIS — R262 Difficulty in walking, not elsewhere classified: Secondary | ICD-10-CM | POA: Diagnosis not present

## 2022-07-10 DIAGNOSIS — M25561 Pain in right knee: Secondary | ICD-10-CM | POA: Diagnosis not present

## 2022-07-10 DIAGNOSIS — M6281 Muscle weakness (generalized): Secondary | ICD-10-CM | POA: Diagnosis not present

## 2022-07-13 DIAGNOSIS — R262 Difficulty in walking, not elsewhere classified: Secondary | ICD-10-CM | POA: Diagnosis not present

## 2022-07-13 DIAGNOSIS — M6281 Muscle weakness (generalized): Secondary | ICD-10-CM | POA: Diagnosis not present

## 2022-07-13 DIAGNOSIS — M25561 Pain in right knee: Secondary | ICD-10-CM | POA: Diagnosis not present

## 2022-07-15 DIAGNOSIS — R262 Difficulty in walking, not elsewhere classified: Secondary | ICD-10-CM | POA: Diagnosis not present

## 2022-07-15 DIAGNOSIS — M25561 Pain in right knee: Secondary | ICD-10-CM | POA: Diagnosis not present

## 2022-07-15 DIAGNOSIS — M6281 Muscle weakness (generalized): Secondary | ICD-10-CM | POA: Diagnosis not present

## 2022-07-22 DIAGNOSIS — M1711 Unilateral primary osteoarthritis, right knee: Secondary | ICD-10-CM | POA: Diagnosis not present

## 2022-07-22 DIAGNOSIS — S62144D Nondisplaced fracture of body of hamate [unciform] bone, right wrist, subsequent encounter for fracture with routine healing: Secondary | ICD-10-CM | POA: Diagnosis not present

## 2022-07-28 ENCOUNTER — Ambulatory Visit
Admission: RE | Admit: 2022-07-28 | Discharge: 2022-07-28 | Disposition: A | Discharge status: Home / Self Care | Payer: Medicare Other | Source: Ambulatory Visit | Attending: Internal Medicine | Admitting: Internal Medicine

## 2022-07-28 DIAGNOSIS — Z0000 Encounter for general adult medical examination without abnormal findings: Secondary | ICD-10-CM

## 2022-07-28 DIAGNOSIS — Z1231 Encounter for screening mammogram for malignant neoplasm of breast: Secondary | ICD-10-CM | POA: Diagnosis not present

## 2022-08-19 DIAGNOSIS — L988 Other specified disorders of the skin and subcutaneous tissue: Secondary | ICD-10-CM | POA: Diagnosis not present

## 2022-08-19 DIAGNOSIS — D0372 Melanoma in situ of left lower limb, including hip: Secondary | ICD-10-CM | POA: Diagnosis not present

## 2022-09-22 DIAGNOSIS — E669 Obesity, unspecified: Secondary | ICD-10-CM | POA: Diagnosis not present

## 2022-09-22 DIAGNOSIS — F419 Anxiety disorder, unspecified: Secondary | ICD-10-CM | POA: Diagnosis not present

## 2022-09-22 DIAGNOSIS — F39 Unspecified mood [affective] disorder: Secondary | ICD-10-CM | POA: Diagnosis not present

## 2022-09-22 DIAGNOSIS — E039 Hypothyroidism, unspecified: Secondary | ICD-10-CM | POA: Diagnosis not present

## 2022-09-22 DIAGNOSIS — E213 Hyperparathyroidism, unspecified: Secondary | ICD-10-CM | POA: Diagnosis not present

## 2022-09-22 DIAGNOSIS — I10 Essential (primary) hypertension: Secondary | ICD-10-CM | POA: Diagnosis not present

## 2022-09-22 DIAGNOSIS — I700 Atherosclerosis of aorta: Secondary | ICD-10-CM | POA: Diagnosis not present

## 2022-09-22 DIAGNOSIS — Z0000 Encounter for general adult medical examination without abnormal findings: Secondary | ICD-10-CM | POA: Diagnosis not present

## 2022-09-22 DIAGNOSIS — D038 Melanoma in situ of other sites: Secondary | ICD-10-CM | POA: Diagnosis not present

## 2022-09-22 DIAGNOSIS — E7800 Pure hypercholesterolemia, unspecified: Secondary | ICD-10-CM | POA: Diagnosis not present

## 2022-09-22 DIAGNOSIS — R7303 Prediabetes: Secondary | ICD-10-CM | POA: Diagnosis not present

## 2022-09-22 DIAGNOSIS — E063 Autoimmune thyroiditis: Secondary | ICD-10-CM | POA: Diagnosis not present

## 2022-09-22 DIAGNOSIS — R0602 Shortness of breath: Secondary | ICD-10-CM | POA: Diagnosis not present

## 2022-09-30 ENCOUNTER — Encounter: Payer: Self-pay | Admitting: Cardiology

## 2022-09-30 ENCOUNTER — Ambulatory Visit: Payer: Medicare Other | Admitting: Cardiology

## 2022-09-30 VITALS — BP 152/66 | HR 56 | Ht 60.0 in | Wt 160.0 lb

## 2022-09-30 DIAGNOSIS — I10 Essential (primary) hypertension: Secondary | ICD-10-CM | POA: Insufficient documentation

## 2022-09-30 DIAGNOSIS — Z01812 Encounter for preprocedural laboratory examination: Secondary | ICD-10-CM | POA: Diagnosis not present

## 2022-09-30 DIAGNOSIS — R072 Precordial pain: Secondary | ICD-10-CM | POA: Insufficient documentation

## 2022-09-30 MED ORDER — IRBESARTAN 150 MG PO TABS: 150.0000 mg | ORAL_TABLET | Freq: Every day | ORAL | 3 refills | Status: DC

## 2022-09-30 MED ORDER — METOPROLOL TARTRATE 25 MG PO TABS: 25.0000 mg | ORAL_TABLET | ORAL | 0 refills | Status: DC

## 2022-09-30 NOTE — Patient Instructions (Signed)
Medication Instructions:  Please discontinue your Hydralazine and start Irbesartan 150 mg once a day. Continue all other medications as listed.   *If you need a refill on your cardiac medications before your next appointment, please call your pharmacy*   Lab Work: Please have blood work today (BMP)  If you have labs (blood work) drawn today and your tests are completely normal, you will receive your results only by: MyChart Message (if you have MyChart) OR A paper copy in the mail If you have any lab test that is abnormal or we need to change your treatment, we will call you to review the results.   Testing/Procedures:   Your cardiac CT will be scheduled at:   [facility] [address] [place], [place] [address] [phone]  Please arrive at the [facility] Entrance (Entrance C2) of [facility] 30 minutes prior to test start time. You can use the FREE valet parking offered at entrance C (encouraged to control the heart rate for the test)  Proceed to the [facility] Radiology Department (first floor) to check-in and test prep.  All radiology patients and guests should use entrance C2 at [facility], accessed from [facility], even though the hospital's physical address listed is [address]       Please follow these instructions carefully (unless otherwise directed):  An IV will be required for this test and Nitroglycerin will be given.   On the Night Before the Test: Be sure to Drink plenty of water. Do not consume any caffeinated/decaffeinated beverages or chocolate 12 hours prior to your test. Do not take any antihistamines 12 hours prior to your test.  On the Day of the Test: Drink plenty of water until 1 hour prior to the test. Do not eat any food 1 hour prior to test. You may take your regular medications prior to the test.  Take metoprolol (Lopressor) two hours prior to test. If you take  Furosemide/Hydrochlorothiazide/Spironolactone, please HOLD on the morning of the test. FEMALES- please wear underwire-free bra if available, avoid dresses & tight clothing  After the Test: Drink plenty of water. After receiving IV contrast, you may experience a mild flushed feeling. This is normal. On occasion, you may experience a mild rash up to 24 hours after the test. This is not dangerous. If this occurs, you can take Benadryl 25 mg and increase your fluid intake. If you experience trouble breathing, this can be serious. If it is severe call 911 IMMEDIATELY. If it is mild, please call our office. If you take any of these medications: Glipizide/Metformin, Avandament, Glucavance, please do not take 48 hours after completing test unless otherwise instructed.  We will call to schedule your test 2-4 weeks out understanding that some insurance companies will need an authorization prior to the service being performed.   For more information and frequently asked questions, please visit our website : [email or web address]  For non-scheduling related questions, please contact the cardiac imaging nurse navigator should you have any questions/concerns: Cardiac Imaging Nurse Navigators Direct Office Dial: [phone]   For scheduling needs, including cancellations and rescheduling, please call [place], [phone].   Follow-Up: At [facility], you and your health needs are our priority.  As part of our continuing mission to provide you with exceptional heart care, we have created designated Provider Care Teams.  These Care Teams include your primary Cardiologist (physician) and Advanced Practice Providers (APPs -  Physician Assistants and Nurse Practitioners)  who all work together to provide you with the care you need, when you need it.  We recommend signing up for the patient portal called "MyChart".  Sign up information is provided on this After Visit Summary.  MyChart is  used to connect with patients for Virtual Visits (Telemedicine).  Patients are able to view lab/test results, encounter notes, upcoming appointments, etc.  Non-urgent messages can be sent to your provider as well.   To learn more about what you can do with MyChart, go to [email or web address].    Your next appointment:   Follow up will be based on the results of the above testing.
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
## 2022-10-12 ENCOUNTER — Telehealth (HOSPITAL_COMMUNITY): Payer: Self-pay | Admitting: *Deleted

## 2022-10-12 NOTE — Telephone Encounter (Signed)
Reaching out to patient to offer assistance regarding upcoming cardiac imaging study; pt verbalizes understanding of appt date/time, parking situation and where to check in, pre-test NPO status and medications ordered, and verified current allergies; name and call back number provided for further questions should they arise Hayley Sharpe RN Navigator Cardiac Imaging [facility] [phone] office [phone] cell  

## 2022-10-13 ENCOUNTER — Ambulatory Visit (HOSPITAL_COMMUNITY)
Admission: RE | Admit: 2022-10-13 | Discharge: 2022-10-13 | Disposition: A | Discharge status: Home / Self Care | Payer: Medicare Other | Source: Ambulatory Visit | Attending: Cardiology | Admitting: Cardiology

## 2022-10-13 DIAGNOSIS — R072 Precordial pain: Secondary | ICD-10-CM | POA: Insufficient documentation

## 2022-10-13 DIAGNOSIS — R079 Chest pain, unspecified: Secondary | ICD-10-CM | POA: Diagnosis not present

## 2022-10-13 DIAGNOSIS — R911 Solitary pulmonary nodule: Secondary | ICD-10-CM | POA: Diagnosis not present

## 2022-10-13 DIAGNOSIS — I7090 Unspecified atherosclerosis: Secondary | ICD-10-CM | POA: Diagnosis not present

## 2022-10-13 DIAGNOSIS — J9811 Atelectasis: Secondary | ICD-10-CM | POA: Diagnosis not present

## 2022-10-13 MED ORDER — NITROGLYCERIN 0.4 MG SL SUBL
SUBLINGUAL_TABLET | SUBLINGUAL | Status: AC
  Filled 2022-10-13: qty 2

## 2022-10-13 MED ORDER — NITROGLYCERIN 0.4 MG SL SUBL
0.8000 mg | SUBLINGUAL_TABLET | Freq: Once | SUBLINGUAL | Status: AC
  Administered 2022-10-13: 0.8 mg via SUBLINGUAL

## 2022-10-13 MED ORDER — IOHEXOL 350 MG/ML SOLN
100.0000 mL | Freq: Once | INTRAVENOUS | Status: AC | PRN
  Administered 2022-10-13: 100 mL via INTRAVENOUS

## 2022-10-15 ENCOUNTER — Telehealth: Payer: Self-pay

## 2022-10-15 DIAGNOSIS — E785 Hyperlipidemia, unspecified: Secondary | ICD-10-CM

## 2022-10-15 MED ORDER — ROSUVASTATIN CALCIUM 10 MG PO TABS: 10.0000 mg | ORAL_TABLET | Freq: Every day | ORAL | 3 refills | Status: DC

## 2022-10-15 NOTE — Telephone Encounter (Signed)
Patient states she is unable to tolerate Avapro (irbesartan) that was added at last office visit on 09/30/22 by Dr Anne Fu. She only had 2 days worth but took herself off of irbesartan and back on her hydralazine, due to dizziness. Patient unable to provide blood pressure readings while on irbesartan but states her BP the last 3 nights on her hydralazine are as follows: 130/56, 151/59, and 147/58. Will make Dr Anne Fu aware for recommendations on blood pressure medication recommendation.

## 2022-10-15 NOTE — Telephone Encounter (Signed)
Spoke with patient concerning recent lab work and Dr Anne Fu recommendations. Patient agrees to increase Crestor to 10 mg once daily and repeat lipids scheduled for 12/15/22, sent in new prescription for updated dosage.    ----- Message from Donato Schultz sent at 10/15/2022  6:49 AM EDT ----- Mild non obstructive coronary artery disease noted on CT. Let's increase Crestor from 5mg  to 10mg  once a day for added stabilization and protection Repeat lipid panel in 2 months (prior LDL 73, goal < 70)

## 2022-10-15 NOTE — Telephone Encounter (Signed)
-----   Message from Leming sent at 10/15/2022  6:49 AM EDT ----- Mild non obstructive coronary artery disease noted on CT. Let's increase Crestor from 5mg  to 10mg  once a day for added stabilization and protection Repeat lipid panel in 2 months (prior LDL 73, goal < 70)

## 2022-10-19 NOTE — Telephone Encounter (Signed)
Spoke with patient about Dr Anne Fu recommendation of having her PCP manage her blood pressure medications. Patient agrees with plan since she originally prescribed her hydralazine for her. Per patient, blood pressure has been well controlled at home since back on her hydralazine. No questions at this time.

## 2022-10-21 DIAGNOSIS — Z85820 Personal history of malignant melanoma of skin: Secondary | ICD-10-CM | POA: Diagnosis not present

## 2022-10-21 DIAGNOSIS — D2271 Melanocytic nevi of right lower limb, including hip: Secondary | ICD-10-CM | POA: Diagnosis not present

## 2022-10-21 DIAGNOSIS — D2239 Melanocytic nevi of other parts of face: Secondary | ICD-10-CM | POA: Diagnosis not present

## 2022-10-21 DIAGNOSIS — D225 Melanocytic nevi of trunk: Secondary | ICD-10-CM | POA: Diagnosis not present

## 2022-10-21 DIAGNOSIS — D2261 Melanocytic nevi of right upper limb, including shoulder: Secondary | ICD-10-CM | POA: Diagnosis not present

## 2022-10-21 DIAGNOSIS — D1801 Hemangioma of skin and subcutaneous tissue: Secondary | ICD-10-CM | POA: Diagnosis not present

## 2022-10-21 DIAGNOSIS — D2262 Melanocytic nevi of left upper limb, including shoulder: Secondary | ICD-10-CM | POA: Diagnosis not present

## 2022-10-23 ENCOUNTER — Telehealth: Payer: Self-pay | Admitting: *Deleted

## 2022-10-23 DIAGNOSIS — R911 Solitary pulmonary nodule: Secondary | ICD-10-CM

## 2022-10-23 NOTE — Telephone Encounter (Signed)
Right middle lobe 6 mm lung nodule. Follow up noncontrast chest CT in 12 months recommended. 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   Recommend increase Crestor to 10mg  once a day. LDL goal < 70  Reviewed results with pt who states understanding.  She reports the crestor had already been increased to 10 mg daily a few days ago.  Aware of referral to Bridget Bryant, NP in the [facility].  She is aware she will be contacted to be scheduled.

## 2022-12-07 DIAGNOSIS — M25512 Pain in left shoulder: Secondary | ICD-10-CM | POA: Diagnosis not present

## 2022-12-07 DIAGNOSIS — M25551 Pain in right hip: Secondary | ICD-10-CM | POA: Diagnosis not present

## 2022-12-10 ENCOUNTER — Ambulatory Visit: Payer: Medicare Other | Admitting: Emergency Medicine

## 2022-12-10 ENCOUNTER — Encounter: Payer: Self-pay | Admitting: Emergency Medicine

## 2022-12-10 VITALS — BP 169/66 | HR 54 | Ht 60.0 in | Wt 160.6 lb

## 2022-12-10 DIAGNOSIS — R0600 Dyspnea, unspecified: Secondary | ICD-10-CM | POA: Insufficient documentation

## 2022-12-10 DIAGNOSIS — R9389 Abnormal findings on diagnostic imaging of other specified body structures: Secondary | ICD-10-CM | POA: Diagnosis not present

## 2022-12-10 DIAGNOSIS — R918 Other nonspecific abnormal finding of lung field: Secondary | ICD-10-CM | POA: Diagnosis not present

## 2022-12-10 DIAGNOSIS — R0609 Other forms of dyspnea: Secondary | ICD-10-CM | POA: Diagnosis not present
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
## 2022-12-10 NOTE — Patient Instructions (Signed)
VISIT SUMMARY:  During your recent visit, we discussed the results of your chest imaging, which showed a stable 6mm nodule in your right middle lobe and a calcified nodule in your right lower lobe. These nodules have not changed since May 2023. We also noted some signs of chronic inflammation, possibly due to environmental exposures. You have been experiencing shortness of breath, particularly during exertion, over the past couple of years.  YOUR PLAN:  -PULMONARY NODULES: These are small growths in your lungs that have been stable and unchanged since 2023. We will schedule a repeat CT scan in May 2025 to ensure a two-year follow-up of the non-calcified nodule.  -CHRONIC INFLAMMATORY CHANGES: These are signs of long-term inflammation in your lungs, possibly due to environmental exposures. We will monitor for any changes in your respiratory symptoms, but no immediate action is required.  -SHORTNESS OF BREATH: You have reported feeling short of breath, especially during physical activity. If these symptoms persist or worsen, we may consider conducting pulmonary function tests to assess your lung function.  INSTRUCTIONS:  Please monitor your respiratory symptoms and report any changes. If your shortness of breath worsens or persists, please contact our office to discuss the possibility of pulmonary function testing. We will also schedule a repeat CT scan for May 2025 to follow up on your lung nodules.

## 2022-12-10 NOTE — Assessment & Plan Note (Signed)
Shortness of Breath Reports of shortness of breath over the past couple of years, particularly with exertion. No current respiratory symptoms.  Mild.  Discussed possible utility of PFT but she would like to defer for now -Consider pulmonary function testing if symptoms persist or worsen.

## 2022-12-10 NOTE — Assessment & Plan Note (Signed)
Pulmonary Nodules Stable 6mm right middle lobe nodule and calcified right lower lobe nodule. No suspicious adenopathy or changes in calcified granulomatous disease. Stability of nodules dating back to May 2023. -Schedule repeat CT in May 2025 to ensure two-year follow-up of non-calcified nodule.  Chronic Inflammatory Changes Findings on imaging suggestive of chronic inflammatory changes, possibly due to environmental exposures. No current respiratory symptoms. -No immediate action required. Monitor for changes in respiratory symptoms.

## 2022-12-15 ENCOUNTER — Ambulatory Visit: Payer: Medicare Other | Attending: Cardiology

## 2022-12-15 DIAGNOSIS — E785 Hyperlipidemia, unspecified: Secondary | ICD-10-CM | POA: Diagnosis not present

## 2022-12-16 DIAGNOSIS — M25511 Pain in right shoulder: Secondary | ICD-10-CM | POA: Diagnosis not present

## 2022-12-16 DIAGNOSIS — M25551 Pain in right hip: Secondary | ICD-10-CM | POA: Diagnosis not present

## 2022-12-16 DIAGNOSIS — M25552 Pain in left hip: Secondary | ICD-10-CM | POA: Diagnosis not present

## 2022-12-16 LAB — LIPID PANEL
Chol/HDL Ratio: 2.4 {ratio} (ref 0.0–4.4)
Cholesterol, Total: 120 mg/dL (ref 100–199)
HDL: 49 mg/dL (ref >39)
LDL Chol Calc (NIH): 50 mg/dL (ref 0–99)
Triglycerides: 118 mg/dL (ref 0–149)
VLDL Cholesterol Cal: 21 mg/dL (ref 5–40)

## 2022-12-31 DIAGNOSIS — M25552 Pain in left hip: Secondary | ICD-10-CM | POA: Diagnosis not present

## 2022-12-31 DIAGNOSIS — M25511 Pain in right shoulder: Secondary | ICD-10-CM | POA: Diagnosis not present

## 2022-12-31 DIAGNOSIS — M25551 Pain in right hip: Secondary | ICD-10-CM | POA: Diagnosis not present

## 2023-01-05 DIAGNOSIS — M25551 Pain in right hip: Secondary | ICD-10-CM | POA: Diagnosis not present

## 2023-01-05 DIAGNOSIS — M25552 Pain in left hip: Secondary | ICD-10-CM | POA: Diagnosis not present

## 2023-01-05 DIAGNOSIS — M25511 Pain in right shoulder: Secondary | ICD-10-CM | POA: Diagnosis not present

## 2023-01-07 DIAGNOSIS — M25552 Pain in left hip: Secondary | ICD-10-CM | POA: Diagnosis not present

## 2023-01-07 DIAGNOSIS — M25511 Pain in right shoulder: Secondary | ICD-10-CM | POA: Diagnosis not present

## 2023-01-07 DIAGNOSIS — M25551 Pain in right hip: Secondary | ICD-10-CM | POA: Diagnosis not present

## 2023-01-19 DIAGNOSIS — M25511 Pain in right shoulder: Secondary | ICD-10-CM | POA: Diagnosis not present

## 2023-01-19 DIAGNOSIS — M25551 Pain in right hip: Secondary | ICD-10-CM | POA: Diagnosis not present

## 2023-01-19 DIAGNOSIS — M25552 Pain in left hip: Secondary | ICD-10-CM | POA: Diagnosis not present

## 2023-03-16 DIAGNOSIS — D1801 Hemangioma of skin and subcutaneous tissue: Secondary | ICD-10-CM | POA: Diagnosis not present

## 2023-03-16 DIAGNOSIS — D2261 Melanocytic nevi of right upper limb, including shoulder: Secondary | ICD-10-CM | POA: Diagnosis not present

## 2023-03-16 DIAGNOSIS — D2262 Melanocytic nevi of left upper limb, including shoulder: Secondary | ICD-10-CM | POA: Diagnosis not present

## 2023-03-16 DIAGNOSIS — D225 Melanocytic nevi of trunk: Secondary | ICD-10-CM | POA: Diagnosis not present

## 2023-03-16 DIAGNOSIS — L821 Other seborrheic keratosis: Secondary | ICD-10-CM | POA: Diagnosis not present

## 2023-03-16 DIAGNOSIS — D485 Neoplasm of uncertain behavior of skin: Secondary | ICD-10-CM | POA: Diagnosis not present

## 2023-03-25 DIAGNOSIS — I447 Left bundle-branch block, unspecified: Secondary | ICD-10-CM | POA: Diagnosis not present

## 2023-03-25 DIAGNOSIS — F419 Anxiety disorder, unspecified: Secondary | ICD-10-CM | POA: Diagnosis not present

## 2023-03-25 DIAGNOSIS — I10 Essential (primary) hypertension: Secondary | ICD-10-CM | POA: Diagnosis not present

## 2023-03-25 DIAGNOSIS — E039 Hypothyroidism, unspecified: Secondary | ICD-10-CM | POA: Diagnosis not present

## 2023-03-25 DIAGNOSIS — E7800 Pure hypercholesterolemia, unspecified: Secondary | ICD-10-CM | POA: Diagnosis not present

## 2023-03-25 DIAGNOSIS — R911 Solitary pulmonary nodule: Secondary | ICD-10-CM | POA: Diagnosis not present

## 2023-03-25 DIAGNOSIS — Z8261 Family history of arthritis: Secondary | ICD-10-CM | POA: Diagnosis not present

## 2023-03-25 DIAGNOSIS — M2560 Stiffness of unspecified joint, not elsewhere classified: Secondary | ICD-10-CM | POA: Diagnosis not present

## 2023-03-25 DIAGNOSIS — I2510 Atherosclerotic heart disease of native coronary artery without angina pectoris: Secondary | ICD-10-CM | POA: Diagnosis not present

## 2023-04-06 DIAGNOSIS — M1990 Unspecified osteoarthritis, unspecified site: Secondary | ICD-10-CM | POA: Diagnosis not present

## 2023-04-06 DIAGNOSIS — M25562 Pain in left knee: Secondary | ICD-10-CM | POA: Diagnosis not present

## 2023-04-06 DIAGNOSIS — M79643 Pain in unspecified hand: Secondary | ICD-10-CM | POA: Diagnosis not present

## 2023-04-06 DIAGNOSIS — M8580 Other specified disorders of bone density and structure, unspecified site: Secondary | ICD-10-CM | POA: Diagnosis not present

## 2023-04-06 DIAGNOSIS — M25561 Pain in right knee: Secondary | ICD-10-CM | POA: Diagnosis not present

## 2023-04-06 DIAGNOSIS — R768 Other specified abnormal immunological findings in serum: Secondary | ICD-10-CM | POA: Diagnosis not present

## 2023-04-06 DIAGNOSIS — M549 Dorsalgia, unspecified: Secondary | ICD-10-CM | POA: Diagnosis not present

## 2023-04-06 DIAGNOSIS — M2550 Pain in unspecified joint: Secondary | ICD-10-CM | POA: Diagnosis not present

## 2023-04-06 DIAGNOSIS — M25569 Pain in unspecified knee: Secondary | ICD-10-CM | POA: Diagnosis not present

## 2023-04-08 ENCOUNTER — Other Ambulatory Visit: Payer: Self-pay | Admitting: Rheumatology

## 2023-04-08 DIAGNOSIS — M549 Dorsalgia, unspecified: Secondary | ICD-10-CM

## 2023-04-22 DIAGNOSIS — H52203 Unspecified astigmatism, bilateral: Secondary | ICD-10-CM | POA: Diagnosis not present

## 2023-04-22 DIAGNOSIS — H25813 Combined forms of age-related cataract, bilateral: Secondary | ICD-10-CM | POA: Diagnosis not present

## 2023-04-22 DIAGNOSIS — H5213 Myopia, bilateral: Secondary | ICD-10-CM | POA: Diagnosis not present

## 2023-04-22 DIAGNOSIS — E119 Type 2 diabetes mellitus without complications: Secondary | ICD-10-CM | POA: Diagnosis not present

## 2023-04-26 ENCOUNTER — Ambulatory Visit
Admission: RE | Admit: 2023-04-26 | Discharge: 2023-04-26 | Disposition: A | Discharge status: Home / Self Care | Payer: Medicare Other | Source: Ambulatory Visit | Attending: Rheumatology | Admitting: Rheumatology

## 2023-04-26 DIAGNOSIS — M549 Dorsalgia, unspecified: Secondary | ICD-10-CM

## 2023-04-26 DIAGNOSIS — M545 Low back pain, unspecified: Secondary | ICD-10-CM | POA: Diagnosis not present

## 2023-05-12 DIAGNOSIS — R768 Other specified abnormal immunological findings in serum: Secondary | ICD-10-CM | POA: Diagnosis not present

## 2023-05-12 DIAGNOSIS — M25569 Pain in unspecified knee: Secondary | ICD-10-CM | POA: Diagnosis not present

## 2023-05-12 DIAGNOSIS — M8580 Other specified disorders of bone density and structure, unspecified site: Secondary | ICD-10-CM | POA: Diagnosis not present

## 2023-05-12 DIAGNOSIS — M549 Dorsalgia, unspecified: Secondary | ICD-10-CM | POA: Diagnosis not present

## 2023-05-12 DIAGNOSIS — M1990 Unspecified osteoarthritis, unspecified site: Secondary | ICD-10-CM | POA: Diagnosis not present

## 2023-05-12 DIAGNOSIS — M79643 Pain in unspecified hand: Secondary | ICD-10-CM | POA: Diagnosis not present

## 2023-05-12 DIAGNOSIS — M2550 Pain in unspecified joint: Secondary | ICD-10-CM | POA: Diagnosis not present

## 2023-05-12 DIAGNOSIS — M545 Low back pain, unspecified: Secondary | ICD-10-CM | POA: Diagnosis not present

## 2023-06-03 ENCOUNTER — Ambulatory Visit
Admission: RE | Admit: 2023-06-03 | Discharge: 2023-06-03 | Disposition: A | Discharge status: Home / Self Care | Source: Ambulatory Visit | Attending: Emergency Medicine | Admitting: Emergency Medicine

## 2023-06-03 DIAGNOSIS — R918 Other nonspecific abnormal finding of lung field: Secondary | ICD-10-CM | POA: Diagnosis not present

## 2023-06-03 DIAGNOSIS — I700 Atherosclerosis of aorta: Secondary | ICD-10-CM | POA: Diagnosis not present

## 2023-06-23 ENCOUNTER — Ambulatory Visit (INDEPENDENT_AMBULATORY_CARE_PROVIDER_SITE_OTHER): Admitting: Obstetrics and Gynecology

## 2023-06-23 ENCOUNTER — Encounter: Payer: Self-pay | Admitting: Obstetrics and Gynecology

## 2023-06-23 VITALS — BP 114/70 | Resp 16 | Ht 59.75 in | Wt 165.0 lb

## 2023-06-23 DIAGNOSIS — Z1231 Encounter for screening mammogram for malignant neoplasm of breast: Secondary | ICD-10-CM

## 2023-06-23 DIAGNOSIS — M810 Age-related osteoporosis without current pathological fracture: Secondary | ICD-10-CM | POA: Diagnosis not present

## 2023-06-23 DIAGNOSIS — E2839 Other primary ovarian failure: Secondary | ICD-10-CM

## 2023-06-23 DIAGNOSIS — M51371 Other intervertebral disc degeneration, lumbosacral region with lower extremity pain only: Secondary | ICD-10-CM

## 2023-06-23 DIAGNOSIS — M8000XA Age-related osteoporosis with current pathological fracture, unspecified site, initial encounter for fracture: Secondary | ICD-10-CM | POA: Diagnosis not present

## 2023-06-23 DIAGNOSIS — Z01419 Encounter for gynecological examination (general) (routine) without abnormal findings: Secondary | ICD-10-CM | POA: Diagnosis not present
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
## 2023-06-24 ENCOUNTER — Encounter: Payer: Self-pay | Admitting: Obstetrics and Gynecology

## 2023-06-24 LAB — COMPREHENSIVE METABOLIC PANEL WITH GFR
AG Ratio: 1.9 (calc) (ref 1.0–2.5)
ALT: 19 U/L (ref 6–29)
AST: 16 U/L (ref 10–35)
Albumin: 4.1 g/dL (ref 3.6–5.1)
Alkaline phosphatase (APISO): 93 U/L (ref 37–153)
BUN: 23 mg/dL (ref 7–25)
CO2: 27 mmol/L (ref 20–32)
Calcium: 9.6 mg/dL (ref 8.6–10.4)
Chloride: 105 mmol/L (ref 98–110)
Creat: 1 mg/dL (ref 0.60–1.00)
Globulin: 2.2 g/dL (ref 1.9–3.7)
Glucose, Bld: 107 mg/dL — ABNORMAL HIGH (ref 65–99)
Potassium: 3.9 mmol/L (ref 3.5–5.3)
Sodium: 143 mmol/L (ref 135–146)
Total Bilirubin: 0.8 mg/dL (ref 0.2–1.2)
Total Protein: 6.3 g/dL (ref 6.1–8.1)
eGFR: 59 mL/min/{1.73_m2} — ABNORMAL LOW (ref >=60)

## 2023-06-24 LAB — VITAMIN D 25 HYDROXY (VIT D DEFICIENCY, FRACTURES): Vit D, 25-Hydroxy: 41 ng/mL (ref 30–100)

## 2023-06-30 ENCOUNTER — Other Ambulatory Visit: Payer: Self-pay | Admitting: *Deleted

## 2023-06-30 DIAGNOSIS — M810 Age-related osteoporosis without current pathological fracture: Secondary | ICD-10-CM

## 2023-06-30 MED ORDER — ROMOSOZUMAB-AQQG 105 MG/1.17ML ~~LOC~~ SOSY: 210.0000 mg | PREFILLED_SYRINGE | Freq: Once | SUBCUTANEOUS | Status: AC

## 2023-07-02 ENCOUNTER — Other Ambulatory Visit: Payer: Self-pay | Admitting: *Deleted

## 2023-07-02 DIAGNOSIS — M810 Age-related osteoporosis without current pathological fracture: Secondary | ICD-10-CM

## 2023-07-02 MED ORDER — ROMOSOZUMAB-AQQG 105 MG/1.17ML ~~LOC~~ SOSY: 210.0000 mg | PREFILLED_SYRINGE | SUBCUTANEOUS | Status: AC

## 2023-07-07 ENCOUNTER — Encounter: Payer: Self-pay | Admitting: Emergency Medicine

## 2023-07-07 ENCOUNTER — Ambulatory Visit (INDEPENDENT_AMBULATORY_CARE_PROVIDER_SITE_OTHER): Admitting: Emergency Medicine

## 2023-07-07 VITALS — BP 166/80 | HR 56 | Ht 59.0 in | Wt 164.8 lb

## 2023-07-07 DIAGNOSIS — R9389 Abnormal findings on diagnostic imaging of other specified body structures: Secondary | ICD-10-CM

## 2023-07-07 NOTE — Patient Instructions (Signed)
 We reviewed your CT scan of the chest today.  Your pulmonary nodules are unchanged, can be deemed benign.  This is good news.  You should not need any further CT scans to track these unless there is a change in symptoms Please follow-up with Dr. Baldwin Levee if you develop any new respiratory issues.

## 2023-07-07 NOTE — Assessment & Plan Note (Signed)
 Scattered pulmonary nodular disease most consistent with granulomatous disease with calcification.  Her noncalcified 6 mm right middle lobe pulmonary nodule has now been stable for 2 years.  We do not need to follow it any further, can be deemed benign.  She does not need any repeat CT unless there is a clinical change.  She can follow-up with me as needed

## 2023-07-07 NOTE — Progress Notes (Signed)
 Subjective:    Patient ID: Bridget [name], female    DOB: 1950/10/04, 73 y.o.   MRN: [id number]  HPI  ROV 07/07/2023 --follow-up visit 73 year old woman, never smoker with a history of melanoma.  Also hypertension, hypothyroidism, renal calculi.  I saw her in October for her pulmonary nodular disease noted on CT scan of the chest.  She had a 6 mm noncalcified right middle lobe nodule, calcified right lower lobe nodule without any evidence of any cell adenopathy.  There had been stability back to May 2023 and we planned for a repeat CT this month. Today she reports  CT scan of chest 06/03/23 reviewed by me, shows no mediastinal or hilar adenopathy, unchanged calcified right hilar and subcarinal nodes.  Stable 6 x 5 mm right middle lobe pulmonary nodule, stable 4 mm right middle lobe nodule, stable calcified right lower lobe nodule.  Review of Systems As  per HPI  Past Medical History:  Diagnosis Date   Abnormal Pap smear of cervix    Fracture    2025 right wrist after fall   Heart murmur    Hypertension    Hypothyroid    Kidney stones    Melanoma (HCC)    on left leg   Osteoporosis    & osteopenia fracture 2025 right wrist with fall, has RA, thyroid  and POF risks factors   Rheumatoid arthritis (HCC)    not on any treatment. no current joint pain besides hips. Has positive RA factor     Family History  Problem Relation Age of Onset   Breast cancer Mother    Cancer Brother        leukemia   Emphysema Father    Heart attack Father    Other Daughter        rhematoid arthritis     Social History   Socioeconomic History   Marital status: Married    Spouse name: Not on file   Number of children: Not on file   Years of education: Not on file   Highest education level: Not on file  Occupational History   Not on file  Tobacco Use   Smoking status: Never   Smokeless tobacco: Never  Substance and Sexual Activity   Alcohol use: Yes    Comment: occ   Drug use: No    Sexual activity: Not Currently    Partners: Male    Birth control/protection: Post-menopausal, Other-see comments    Comment: husband vasectomy, older than 16, less than 5  Other Topics Concern   Not on file  Social History Narrative   Not on file   Social Drivers of Health   Financial Resource Strain: Not on file  Food Insecurity: Not on file  Transportation Needs: Not on file  Physical Activity: Not on file  Stress: Not on file  Social Connections: Not on file  Intimate Partner Violence: Not on file    - Never smoked - Grew up in a smoking household - Worked as a Engineer, civil (consulting) for 40 years  Allergies  Allergen Reactions   Amlodipine     Other reaction(s): swelling   Beta Adrenergic Blockers     Other reaction(s): fatigue   Codeine  Nausea Only   Lisinopril Other (See Comments)    cough   Vicodin [Hydrocodone-Acetaminophen ] Other (See Comments)    Made her feel off     Outpatient Medications Prior to Visit  Medication Sig Dispense Refill   aspirin  EC 81 MG tablet Take 81 mg  by mouth daily.     hydrochlorothiazide  (HYDRODIURIL ) 25 MG tablet Take 25 mg by mouth every morning.     ibuprofen  (ADVIL ) 600 MG tablet Take 1 tablet (600 mg total) by mouth 4 (four) times daily. 120 tablet 1   irbesartan  (AVAPRO ) 150 MG tablet Take 150 mg by mouth daily.     LORazepam  (ATIVAN ) 1 MG tablet Take 0.5-1 mg by mouth at bedtime as needed for sleep. For anxiety     melatonin 5 MG TABS Take 5 mg by mouth daily.     Multiple Vitamins-Minerals (MULTIVITAMIN ADULT) CHEW Chew 2 tablets by mouth daily. gummy     oxymetazoline (AFRIN) 0.05 % nasal spray Place 1 spray into both nostrils 2 (two) times daily as needed for congestion.     potassium chloride  (MICRO-K ) 10 MEQ CR capsule Take 10 mEq by mouth daily.     SYNTHROID  125 MCG tablet Take 125 mcg by mouth daily before breakfast.     rosuvastatin  (CRESTOR ) 10 MG tablet Take 1 tablet (10 mg total) by mouth daily. 90 tablet 3   traZODone (DESYREL) 50  MG tablet Take 50 mg by mouth at bedtime as needed for sleep. (Patient not taking: Reported on 07/07/2023)     Facility-Administered Medications Prior to Visit  Medication Dose Route Frequency Provider Last Rate Last Admin   [START ON 07/14/2023] Romosozumab -aqqg (EVENITY ) 105 MG/1. injection 210 mg  210 mg Subcutaneous Once Boswell, Elizabeth K, MD       [START ON 09/09/2023] Romosozumab -aqqg (EVENITY ) 105 MG/1. injection 210 mg  210 mg Subcutaneous Q30 days Reinaldo Caras, MD           Objective:   Physical Exam Vitals:   07/07/23 0902 07/07/23 0907  BP: (!) 170/65 (!) 166/80  Pulse: (!) 56   SpO2: 97%   Weight: 164 lb 12.8 oz (74.8 kg)   Height: 4\' 11"  (1.499 m)     Gen: Pleasant, well-nourished, in no distress,  normal affect  ENT: No lesions,  mouth clear,  oropharynx clear, no postnasal drip  Neck: No JVD, no stridor  Lungs: No use of accessory muscles, no crackles or wheezing on normal respiration, no wheeze on forced expiration  Cardiovascular: RRR, heart sounds normal, no murmur or gallops, no peripheral edema  Musculoskeletal: No deformities, no cyanosis or clubbing  Neuro: alert, awake, non focal  Skin: Warm, no lesions or rash      Assessment & Plan:  Abnormal CT of the chest Scattered pulmonary nodular disease most consistent with granulomatous disease with calcification.  Her noncalcified 6 mm right middle lobe pulmonary nodule has now been stable for 2 years.  We do not need to follow it any further, can be deemed benign.  She does not need any repeat CT unless there is a clinical change.  She can follow-up with me as needed    Racheal Buddle, MD, PhD 07/07/2023, 9:19 AM [facility] [phone] or if no answer before 7:00PM call [phone] For any issues after 7:00PM please call eLink [phone]

## 2023-08-02 ENCOUNTER — Ambulatory Visit (HOSPITAL_BASED_OUTPATIENT_CLINIC_OR_DEPARTMENT_OTHER)
Admission: RE | Admit: 2023-08-02 | Discharge: 2023-08-02 | Disposition: A | Discharge status: Home / Self Care | Source: Ambulatory Visit | Attending: Obstetrics and Gynecology | Admitting: Obstetrics and Gynecology

## 2023-08-02 ENCOUNTER — Ambulatory Visit: Payer: Self-pay | Admitting: Obstetrics and Gynecology

## 2023-08-02 DIAGNOSIS — Z780 Asymptomatic menopausal state: Secondary | ICD-10-CM | POA: Diagnosis not present

## 2023-08-02 DIAGNOSIS — Z1231 Encounter for screening mammogram for malignant neoplasm of breast: Secondary | ICD-10-CM | POA: Diagnosis not present

## 2023-08-02 DIAGNOSIS — E2839 Other primary ovarian failure: Secondary | ICD-10-CM | POA: Diagnosis not present

## 2023-08-02 DIAGNOSIS — M51371 Other intervertebral disc degeneration, lumbosacral region with lower extremity pain only: Secondary | ICD-10-CM | POA: Diagnosis not present

## 2023-08-02 DIAGNOSIS — Z01419 Encounter for gynecological examination (general) (routine) without abnormal findings: Secondary | ICD-10-CM | POA: Insufficient documentation

## 2023-08-02 DIAGNOSIS — M85851 Other specified disorders of bone density and structure, right thigh: Secondary | ICD-10-CM | POA: Diagnosis not present

## 2023-08-02 DIAGNOSIS — M85852 Other specified disorders of bone density and structure, left thigh: Secondary | ICD-10-CM | POA: Diagnosis not present

## 2023-08-10 ENCOUNTER — Ambulatory Visit

## 2023-09-01 DIAGNOSIS — D2271 Melanocytic nevi of right lower limb, including hip: Secondary | ICD-10-CM | POA: Diagnosis not present

## 2023-09-01 DIAGNOSIS — L821 Other seborrheic keratosis: Secondary | ICD-10-CM | POA: Diagnosis not present

## 2023-09-01 DIAGNOSIS — D2239 Melanocytic nevi of other parts of face: Secondary | ICD-10-CM | POA: Diagnosis not present

## 2023-09-01 DIAGNOSIS — D2262 Melanocytic nevi of left upper limb, including shoulder: Secondary | ICD-10-CM | POA: Diagnosis not present

## 2023-09-01 DIAGNOSIS — D225 Melanocytic nevi of trunk: Secondary | ICD-10-CM | POA: Diagnosis not present

## 2023-09-01 DIAGNOSIS — Z85820 Personal history of malignant melanoma of skin: Secondary | ICD-10-CM | POA: Diagnosis not present

## 2023-09-01 DIAGNOSIS — D2261 Melanocytic nevi of right upper limb, including shoulder: Secondary | ICD-10-CM | POA: Diagnosis not present

## 2023-09-22 DIAGNOSIS — I10 Essential (primary) hypertension: Secondary | ICD-10-CM | POA: Diagnosis not present

## 2023-09-22 DIAGNOSIS — E213 Hyperparathyroidism, unspecified: Secondary | ICD-10-CM | POA: Diagnosis not present

## 2023-09-22 DIAGNOSIS — Z0000 Encounter for general adult medical examination without abnormal findings: Secondary | ICD-10-CM | POA: Diagnosis not present

## 2023-09-22 DIAGNOSIS — E669 Obesity, unspecified: Secondary | ICD-10-CM | POA: Diagnosis not present

## 2023-09-22 DIAGNOSIS — E039 Hypothyroidism, unspecified: Secondary | ICD-10-CM | POA: Diagnosis not present

## 2023-09-22 DIAGNOSIS — R7303 Prediabetes: Secondary | ICD-10-CM | POA: Diagnosis not present

## 2023-09-22 DIAGNOSIS — E7800 Pure hypercholesterolemia, unspecified: Secondary | ICD-10-CM | POA: Diagnosis not present

## 2023-09-22 DIAGNOSIS — D038 Melanoma in situ of other sites: Secondary | ICD-10-CM | POA: Diagnosis not present

## 2023-09-22 DIAGNOSIS — I2510 Atherosclerotic heart disease of native coronary artery without angina pectoris: Secondary | ICD-10-CM | POA: Diagnosis not present

## 2023-09-22 DIAGNOSIS — F419 Anxiety disorder, unspecified: Secondary | ICD-10-CM | POA: Diagnosis not present

## 2023-09-22 DIAGNOSIS — I700 Atherosclerosis of aorta: Secondary | ICD-10-CM | POA: Diagnosis not present

## 2023-09-22 DIAGNOSIS — M8580 Other specified disorders of bone density and structure, unspecified site: Secondary | ICD-10-CM | POA: Diagnosis not present

## 2023-09-23 ENCOUNTER — Telehealth: Payer: Self-pay | Admitting: *Deleted

## 2023-09-23 NOTE — Telephone Encounter (Signed)
 Patient left message on 7/30 at 1544 inquiring about osteoporosis medications. Call returned to patient, left message with spouse to return call.

## 2023-09-27 NOTE — Telephone Encounter (Signed)
 Left message to call [facility] Triage at [phone], option 4.

## 2023-09-29 ENCOUNTER — Other Ambulatory Visit: Payer: Self-pay | Admitting: Cardiology

## 2023-10-01 ENCOUNTER — Other Ambulatory Visit: Payer: Self-pay | Admitting: Cardiology

## 2023-10-01 DIAGNOSIS — E785 Hyperlipidemia, unspecified: Secondary | ICD-10-CM

## 2023-10-18 DIAGNOSIS — M545 Low back pain, unspecified: Secondary | ICD-10-CM | POA: Diagnosis not present

## 2023-10-27 NOTE — Telephone Encounter (Signed)
 Patient states for now she does not want to do the evenity . She has increased her calcium , vitamin d  & added exercise. She states she will wait until she does her next bone density & decide then.

## 2023-11-17 DIAGNOSIS — M549 Dorsalgia, unspecified: Secondary | ICD-10-CM | POA: Diagnosis not present

## 2023-11-17 DIAGNOSIS — R768 Other specified abnormal immunological findings in serum: Secondary | ICD-10-CM | POA: Diagnosis not present

## 2023-11-17 DIAGNOSIS — M25569 Pain in unspecified knee: Secondary | ICD-10-CM | POA: Diagnosis not present

## 2023-11-17 DIAGNOSIS — M1990 Unspecified osteoarthritis, unspecified site: Secondary | ICD-10-CM | POA: Diagnosis not present

## 2023-11-17 DIAGNOSIS — M8580 Other specified disorders of bone density and structure, unspecified site: Secondary | ICD-10-CM | POA: Diagnosis not present

## 2023-11-17 DIAGNOSIS — M2550 Pain in unspecified joint: Secondary | ICD-10-CM | POA: Diagnosis not present

## 2023-11-17 DIAGNOSIS — M79643 Pain in unspecified hand: Secondary | ICD-10-CM | POA: Diagnosis not present

## 2023-12-25 ENCOUNTER — Other Ambulatory Visit: Payer: Self-pay | Admitting: Cardiology

## 2023-12-25 DIAGNOSIS — E785 Hyperlipidemia, unspecified: Secondary | ICD-10-CM

## 2024-01-22 ENCOUNTER — Other Ambulatory Visit: Payer: Self-pay | Admitting: Cardiology

## 2024-01-22 DIAGNOSIS — E785 Hyperlipidemia, unspecified: Secondary | ICD-10-CM

## 2024-02-06 ENCOUNTER — Other Ambulatory Visit: Payer: Self-pay | Admitting: Cardiology

## 2024-02-06 DIAGNOSIS — E785 Hyperlipidemia, unspecified: Secondary | ICD-10-CM

## 2024-02-16 ENCOUNTER — Other Ambulatory Visit: Payer: Self-pay | Admitting: Cardiology

## 2024-02-16 DIAGNOSIS — E785 Hyperlipidemia, unspecified: Secondary | ICD-10-CM

## 2024-03-09 ENCOUNTER — Other Ambulatory Visit: Payer: Self-pay | Admitting: Cardiology

## 2024-03-09 DIAGNOSIS — E785 Hyperlipidemia, unspecified: Secondary | ICD-10-CM

## 2024-03-10 NOTE — Telephone Encounter (Signed)
 In accordance with refill protocols, please review and address the following requirements before this medication refill can be authorized:  Labs

## 2024-03-14 NOTE — Telephone Encounter (Signed)
 Please obtain from PCP

## 2024-05-06 IMAGING — DX DG CHEST 1V PORT
1 series · 1 of 1 positions shown · non-contrast
Comparison: Chest x-ray 08/17/2012

CLINICAL DATA: Chest and back pain.

EXAM:
PORTABLE CHEST 1 VIEW

[chest ap]
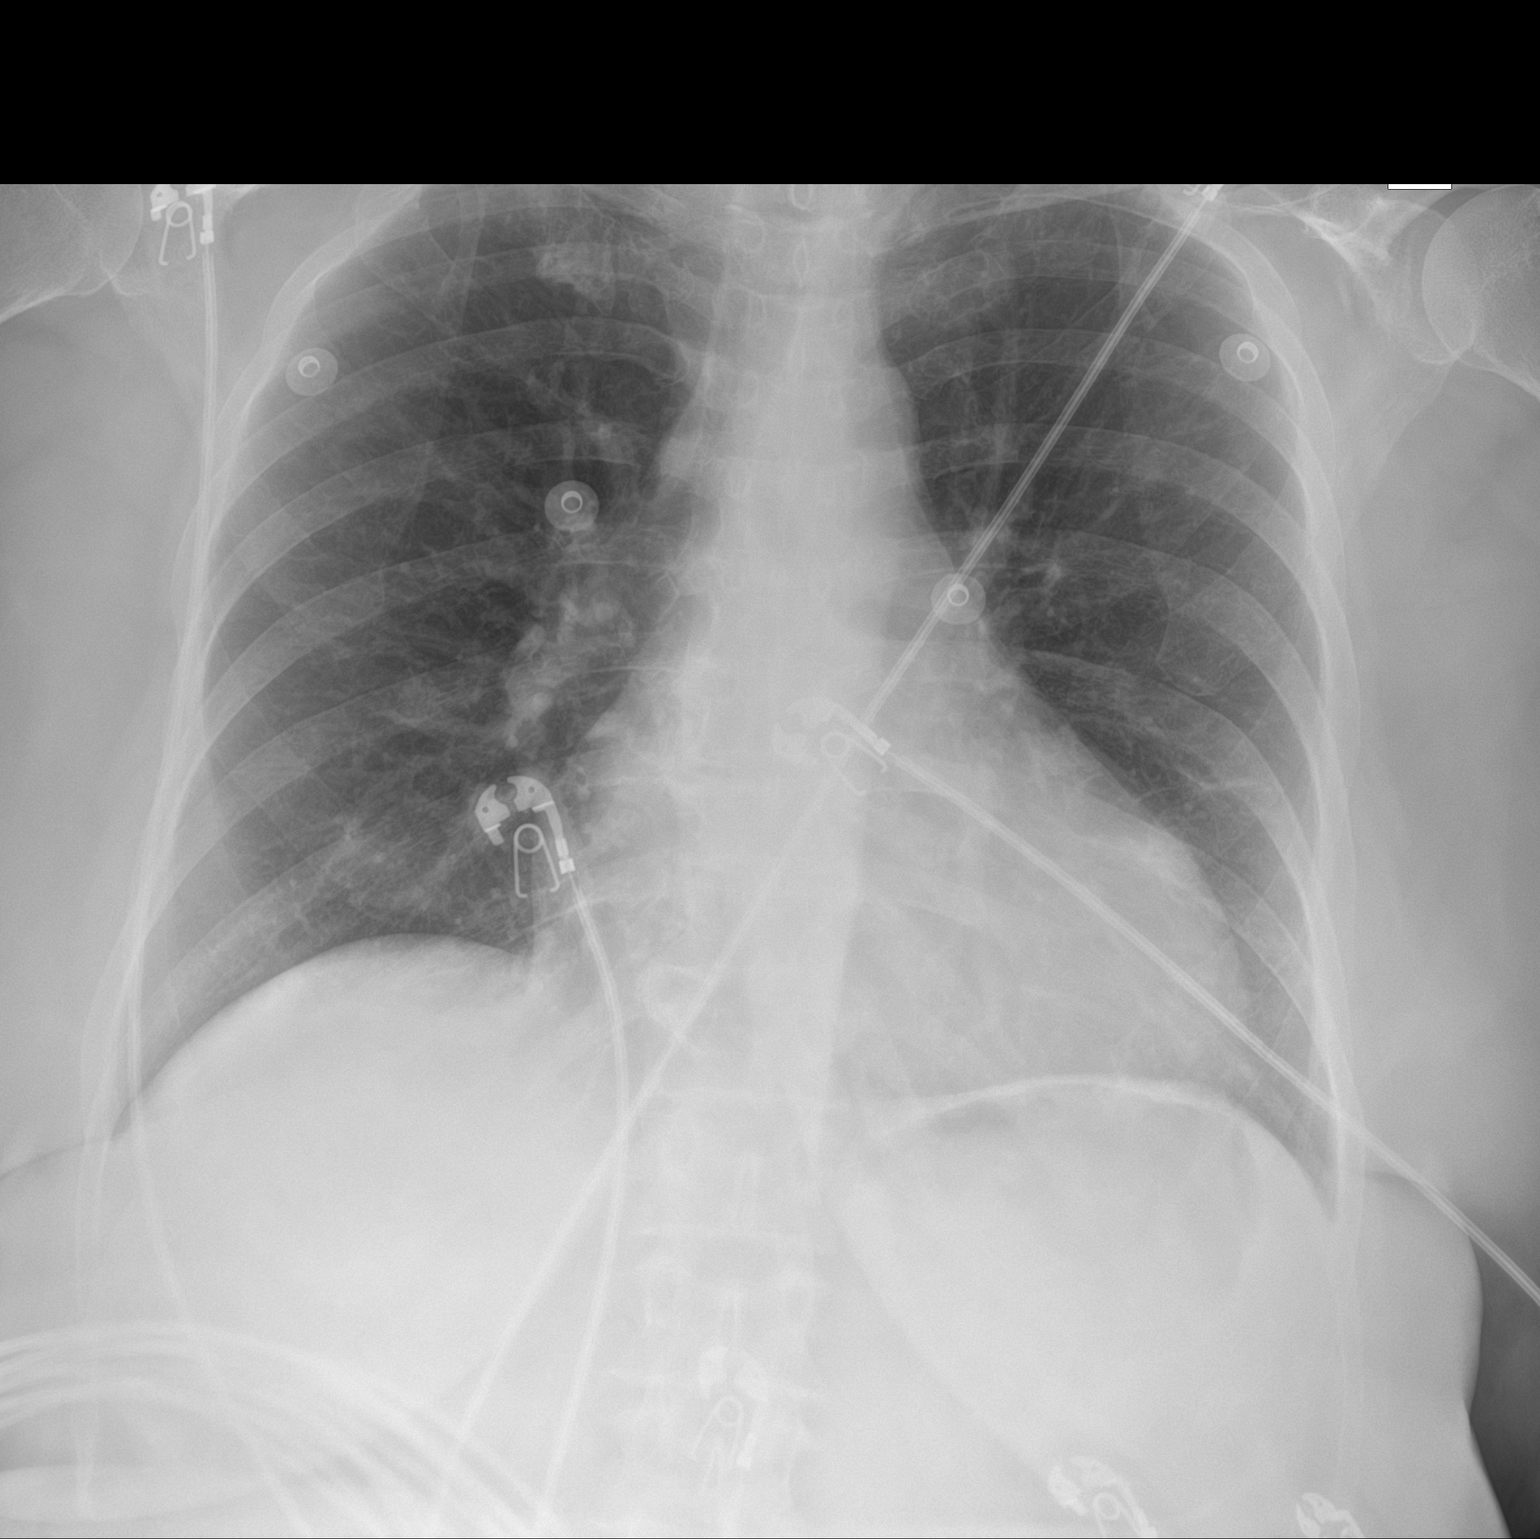

[1 of 1 positions shown; findings below may reference images not displayed]

FINDINGS: The heart size and mediastinal contours are within normal limits.
Both lungs are clear. The visualized skeletal structures are
unremarkable.
IMPRESSION: No active disease.

## 2024-05-06 IMAGING — CT CT ANGIO CHEST-ABD-PELV FOR DISSECTION W/ AND WO/W CM
2 of 7 series · 14 of 46 positions shown, 16 images · non-contrast
Comparison: 10/16/2005.

CLINICAL DATA: Acute aortic syndrome suspected, chest pain.

EXAM:
CT ANGIOGRAPHY CHEST, ABDOMEN AND PELVIS
TECHNIQUE: Non-contrast CT of the chest was initially obtained.

[Series 7: arterial · axial · arterial · 0.73mm/px · z∈[-690,-148]mm · 11 of 309 slices shown, 13 images]
[im 19/309  soft-tissue]
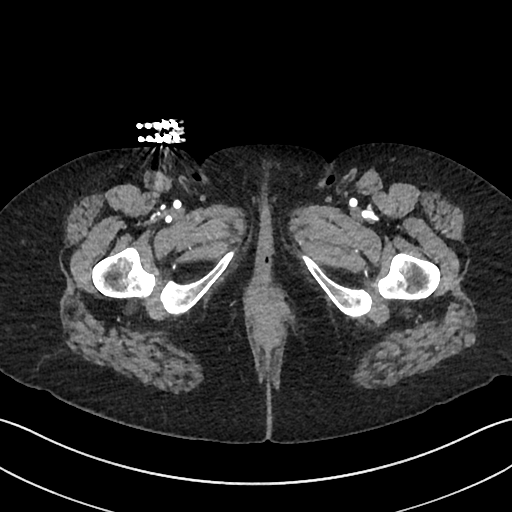
[im 19/309  bone]
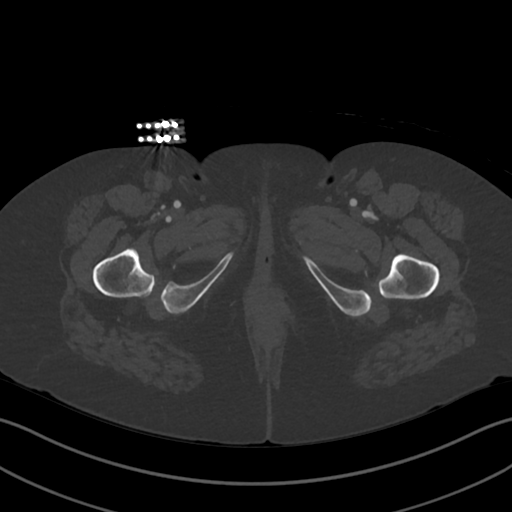
[im 55/309  soft-tissue]
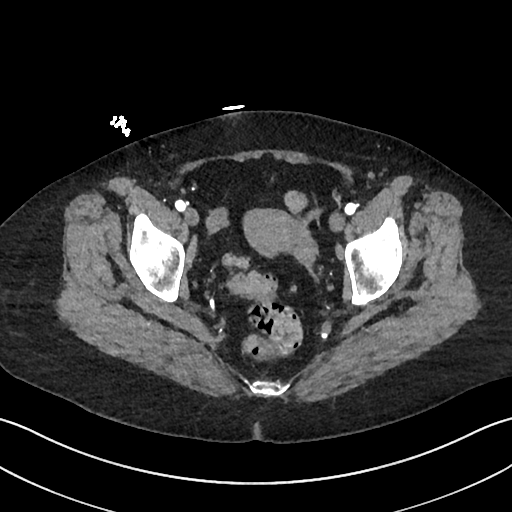
[im 73/309  soft-tissue]
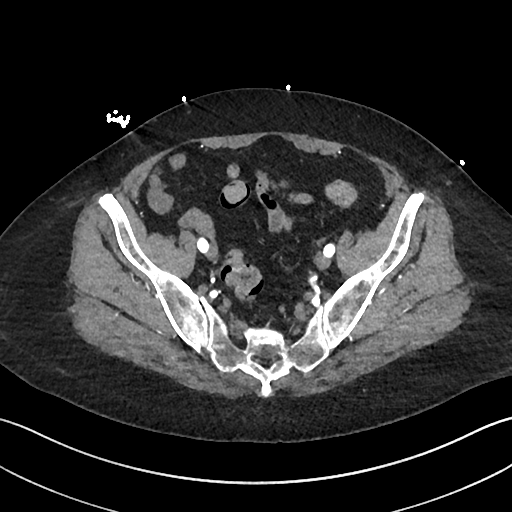
[im 109/309  soft-tissue]
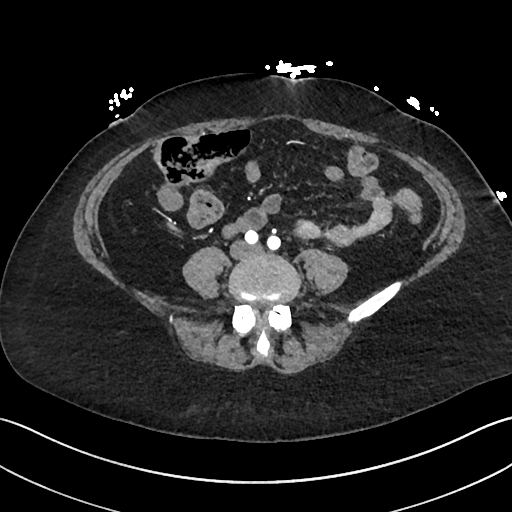
[im 127/309  soft-tissue]
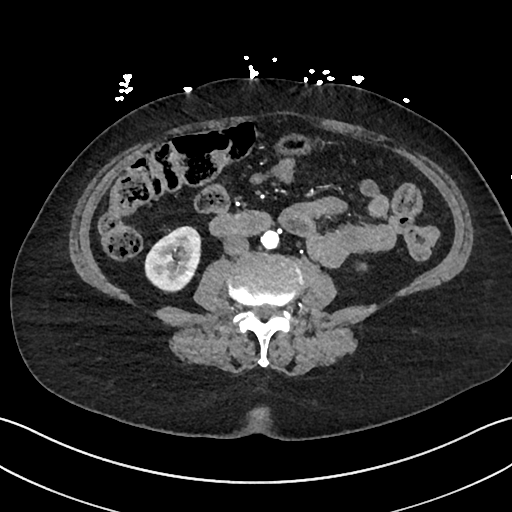
[im 164/309  soft-tissue]
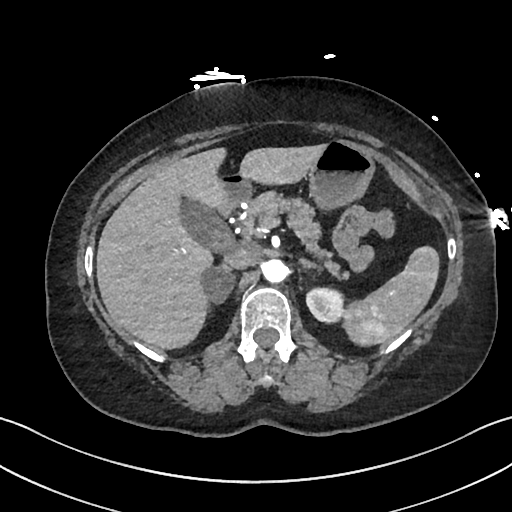
[im 182/309  soft-tissue]
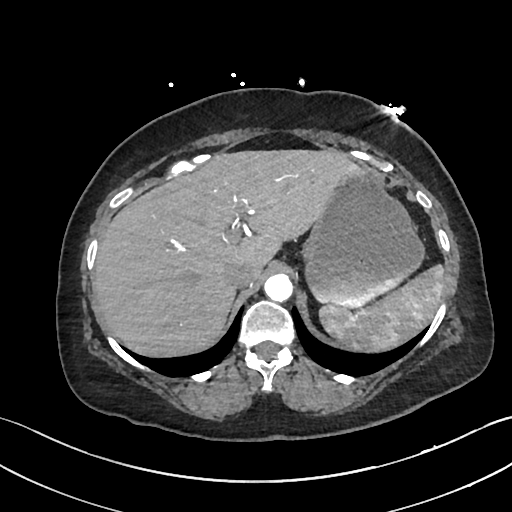
[im 200/309  soft-tissue]
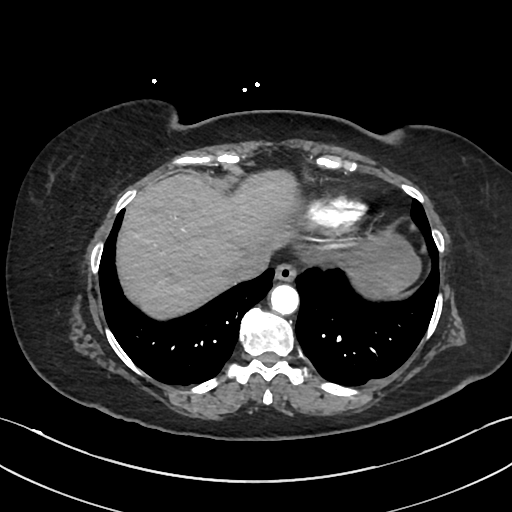
[im 236/309  soft-tissue]
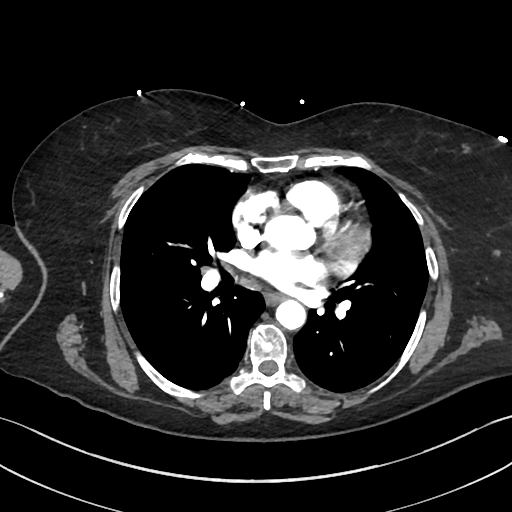
[im 236/309  bone]
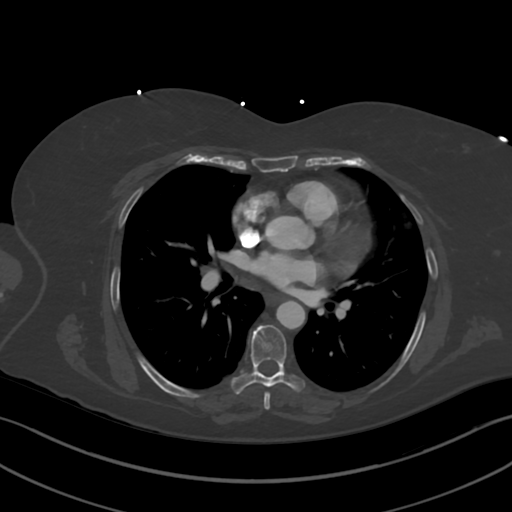
[im 254/309  soft-tissue]
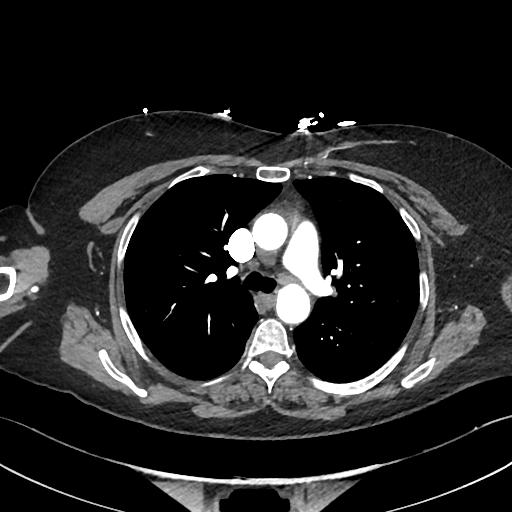
[im 290/309  soft-tissue]
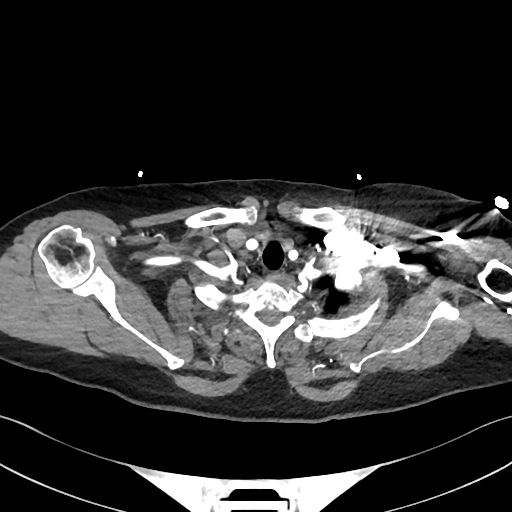

[Series 10: cor · coronal · 0.86mm/px · 3 of 150 slices shown]
[im 38/150  soft-tissue]
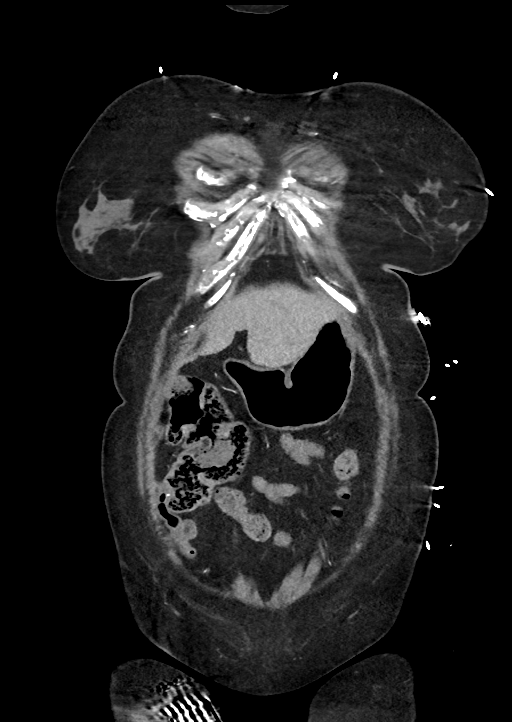
[im 75/150  soft-tissue]
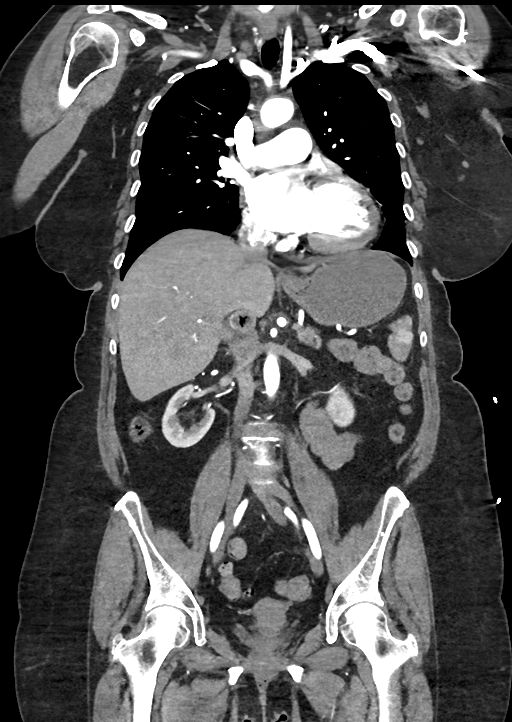
[im 112/150  soft-tissue]
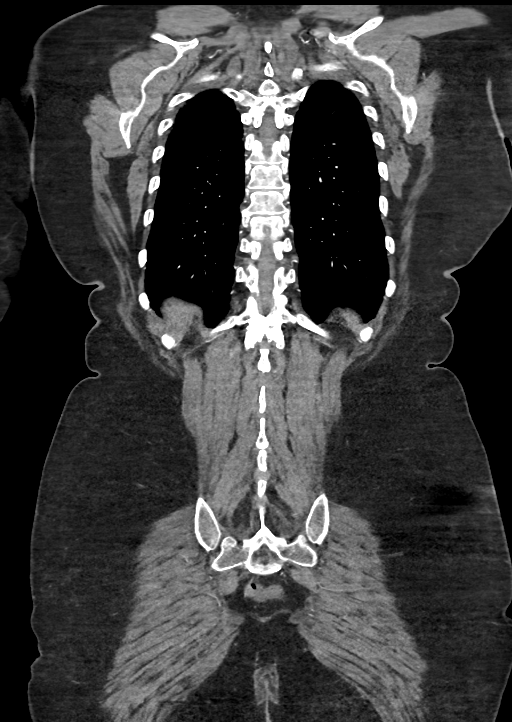

[14 of 46 positions shown; findings below may reference images not displayed]

Multidetector CT imaging through the chest, abdomen and pelvis was
performed using the standard protocol during bolus administration of
intravenous contrast. Multiplanar reconstructed images and MIPs were
obtained and reviewed to evaluate the vascular anatomy.

RADIATION DOSE REDUCTION: This exam was performed according to the
departmental dose-optimization program which includes automated
exposure control, adjustment of the mA and/or kV according to
patient size and/or use of iterative reconstruction technique.

CONTRAST:  100mL OMNIPAQUE IOHEXOL 350 MG/ML SOLN
FINDINGS: CTA CHEST FINDINGS

Cardiovascular: The heart is normal in size and there is no
pericardial effusion. There is mild atherosclerotic calcification of
the aorta without evidence of aneurysm or dissection. The aortic
arch has a bovine configuration. The pulmonary trunk is normal in
caliber.

Mediastinum/Nodes: No enlarged mediastinal, hilar, or axillary lymph
nodes. Thyroid gland, trachea, and esophagus demonstrate no
significant findings.

Lungs/Pleura: Calcified granuloma and scarring are noted in the
medial aspect of the right lower lobe. Mild atelectasis or scarring
is noted bilaterally. No consolidation, effusion, or pneumothorax.

Musculoskeletal: Mild degenerative changes in the thoracic spine. No
acute osseous abnormality.

Review of the MIP images confirms the above findings.

CTA ABDOMEN AND PELVIS FINDINGS

VASCULAR

Aorta: Aortic atherosclerosis. Normal caliber aorta without
aneurysm, dissection, vasculitis or significant stenosis.

Celiac: Patent without evidence of aneurysm, dissection, or
vasculitis. There is focal narrowing of the proximal celiac artery
which may be due to diaphragmatic position versus median arcuate
ligament syndrome.

SMA: Patent without evidence of aneurysm, dissection, vasculitis or
significant stenosis.

Renals: Both renal arteries are patent without evidence of aneurysm,
dissection, vasculitis, fibromuscular dysplasia or significant
stenosis. An accessory renal artery is noted on the left.

IMA: Patent without evidence of aneurysm, dissection, vasculitis or
significant stenosis.

Inflow: Patent without evidence of aneurysm, dissection, vasculitis
or significant stenosis.

Veins: No obvious venous abnormality within the limitations of this
arterial phase study.

Review of the MIP images confirms the above findings.

NON-VASCULAR

Hepatobiliary: No focal liver abnormality is seen. Multiple stones
are present within the gallbladder. No biliary ductal dilatation.

Pancreas: Unremarkable. No pancreatic ductal dilatation or
surrounding inflammatory changes.

Spleen: Normal in size without focal abnormality.

Adrenals/Urinary Tract: There is a 2.6 cm nodule in the right
adrenal gland with attenuation -4 Hounsfield units, compatible with
an adenoma. The left adrenal gland is within normal limits. The
kidneys enhance symmetrically. No renal calculus or hydronephrosis.
Bladder is unremarkable.

Stomach/Bowel: Stomach is within normal limits. The appendix is not
visualized on exam and surgical changes are noted at the cecum. No
evidence of bowel wall thickening, distention, or inflammatory
changes. No free air or pneumatosis.

Lymphatic: No abdominal or pelvic lymphadenopathy.

Reproductive: Uterus and bilateral adnexa are unremarkable.

Other: Small fat containing umbilical hernia. No abdominopelvic
ascites.

Musculoskeletal: Degenerative changes are present in the lumbar
spine. No acute osseous abnormality.

Review of the MIP images confirms the above findings.
IMPRESSION: 1. No evidence of aortic aneurysm or dissection.
2. No acute process in the chest, abdomen, or pelvis.
3. Cholelithiasis.
4. Right adrenal adenoma.

## 2024-06-21 ENCOUNTER — Ambulatory Visit: Admitting: Cardiology
